# Patient Record
Sex: Female | Born: 1980 | Race: White | Hispanic: No | Marital: Married | Smoking: Current every day smoker
Health system: Southern US, Community
[De-identification: ages and names within clinical notes are randomized; demographics above are authoritative.]

## PROBLEM LIST (undated history)

## (undated) DIAGNOSIS — G43909 Migraine, unspecified, not intractable, without status migrainosus: Secondary | ICD-10-CM

## (undated) DIAGNOSIS — R32 Unspecified urinary incontinence: Secondary | ICD-10-CM

## (undated) DIAGNOSIS — F4310 Post-traumatic stress disorder, unspecified: Secondary | ICD-10-CM

## (undated) DIAGNOSIS — F419 Anxiety disorder, unspecified: Secondary | ICD-10-CM

## (undated) DIAGNOSIS — F1910 Other psychoactive substance abuse, uncomplicated: Secondary | ICD-10-CM

## (undated) DIAGNOSIS — F32A Depression, unspecified: Secondary | ICD-10-CM

## (undated) DIAGNOSIS — F329 Major depressive disorder, single episode, unspecified: Secondary | ICD-10-CM

## (undated) HISTORY — DX: Post-traumatic stress disorder, unspecified: F43.10

## (undated) HISTORY — DX: Anxiety disorder, unspecified: F41.9

## (undated) HISTORY — DX: Major depressive disorder, single episode, unspecified: F32.9

## (undated) HISTORY — DX: Unspecified urinary incontinence: R32

## (undated) HISTORY — DX: Depression, unspecified: F32.A

## (undated) HISTORY — DX: Migraine, unspecified, not intractable, without status migrainosus: G43.909

## (undated) HISTORY — PX: AUGMENTATION MAMMAPLASTY: SUR837

## (undated) HISTORY — DX: Other psychoactive substance abuse, uncomplicated: F19.10

---

## 2018-03-01 ENCOUNTER — Encounter (INDEPENDENT_AMBULATORY_CARE_PROVIDER_SITE_OTHER): Payer: Self-pay

## 2018-03-01 ENCOUNTER — Ambulatory Visit (INDEPENDENT_AMBULATORY_CARE_PROVIDER_SITE_OTHER): Payer: 59 | Admitting: Licensed Clinical Social Worker

## 2018-03-01 DIAGNOSIS — F4310 Post-traumatic stress disorder, unspecified: Secondary | ICD-10-CM | POA: Diagnosis not present
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
## 2018-03-07 ENCOUNTER — Ambulatory Visit: Payer: 59 | Admitting: Psychiatry

## 2018-03-10 ENCOUNTER — Ambulatory Visit: Payer: 59 | Admitting: Psychiatry

## 2018-03-10 ENCOUNTER — Ambulatory Visit: Payer: 59 | Admitting: Licensed Clinical Social Worker

## 2018-04-25 ENCOUNTER — Encounter: Payer: Self-pay | Admitting: Emergency Medicine

## 2018-04-25 ENCOUNTER — Emergency Department
Admission: EM | Admit: 2018-04-25 | Discharge: 2018-04-25 | Disposition: A | Discharge status: Home / Self Care | Payer: 59 | Attending: Emergency Medicine | Admitting: Emergency Medicine

## 2018-04-25 ENCOUNTER — Emergency Department: Payer: 59

## 2018-04-25 DIAGNOSIS — R110 Nausea: Secondary | ICD-10-CM | POA: Diagnosis not present

## 2018-04-25 DIAGNOSIS — R109 Unspecified abdominal pain: Secondary | ICD-10-CM | POA: Diagnosis present

## 2018-04-25 DIAGNOSIS — N200 Calculus of kidney: Secondary | ICD-10-CM | POA: Diagnosis not present

## 2018-04-25 LAB — BASIC METABOLIC PANEL
Anion gap: 10 (ref 5–15)
BUN: 18 mg/dL (ref 6–20)
CALCIUM: 9 mg/dL (ref 8.9–10.3)
CO2: 24 mmol/L (ref 22–32)
CREATININE: 0.9 mg/dL (ref 0.44–1.00)
Chloride: 103 mmol/L (ref 98–111)
GLUCOSE: 155 mg/dL — AB (ref 70–99)
Potassium: 3.9 mmol/L (ref 3.5–5.1)
Sodium: 137 mmol/L (ref 135–145)

## 2018-04-25 LAB — URINALYSIS, COMPLETE (UACMP) WITH MICROSCOPIC
Bacteria, UA: NONE SEEN
Bilirubin Urine: NEGATIVE
GLUCOSE, UA: NEGATIVE mg/dL
KETONES UR: NEGATIVE mg/dL
Leukocytes, UA: NEGATIVE
Nitrite: NEGATIVE
PH: 5 (ref 5.0–8.0)
PROTEIN: 100 mg/dL — AB
RBC / HPF: >50 RBC/hpf — ABNORMAL HIGH (ref 0–5)
Specific Gravity, Urine: 1.031 — ABNORMAL HIGH (ref 1.005–1.030)

## 2018-04-25 LAB — CBC
HCT: 40 % (ref 36.0–46.0)
Hemoglobin: 13.7 g/dL (ref 12.0–15.0)
MCH: 30.9 pg (ref 26.0–34.0)
MCHC: 34.3 g/dL (ref 30.0–36.0)
MCV: 90.1 fL (ref 80.0–100.0)
PLATELETS: 346 10*3/uL (ref 150–400)
RBC: 4.44 MIL/uL (ref 3.87–5.11)
RDW: 13.4 % (ref 11.5–15.5)
WBC: 8.3 10*3/uL (ref 4.0–10.5)
nRBC: 0 % (ref 0.0–0.2)

## 2018-04-25 LAB — PREGNANCY, URINE: PREG TEST UR: NEGATIVE

## 2018-04-25 MED ORDER — OXYCODONE-ACETAMINOPHEN 5-325 MG PO TABS
1.0000 | ORAL_TABLET | ORAL | Status: DC | PRN
  Administered 2018-04-25: 1 via ORAL
  Filled 2018-04-25: qty 1

## 2018-04-25 MED ORDER — HYDROMORPHONE HCL 1 MG/ML IJ SOLN
0.5000 mg | Freq: Once | INTRAMUSCULAR | Status: AC
  Administered 2018-04-25: 0.5 mg via INTRAVENOUS
  Filled 2018-04-25: qty 1

## 2018-04-25 MED ORDER — KETOROLAC TROMETHAMINE 30 MG/ML IJ SOLN
30.0000 mg | Freq: Once | INTRAMUSCULAR | Status: AC
  Administered 2018-04-25: 30 mg via INTRAVENOUS
  Filled 2018-04-25: qty 1

## 2018-04-25 MED ORDER — ONDANSETRON HCL 4 MG/2ML IJ SOLN
4.0000 mg | Freq: Once | INTRAMUSCULAR | Status: AC
  Administered 2018-04-25: 4 mg via INTRAVENOUS
  Filled 2018-04-25: qty 2

## 2018-04-25 MED ORDER — SODIUM CHLORIDE 0.9 % IV SOLN
Freq: Once | INTRAVENOUS | Status: AC
  Administered 2018-04-25: 999 mL via INTRAVENOUS

## 2018-04-25 MED ORDER — ONDANSETRON 4 MG PO TBDP: 4.0000 mg | ORAL_TABLET | Freq: Three times a day (TID) | ORAL | 0 refills | Status: DC | PRN

## 2018-04-25 MED ORDER — TAMSULOSIN HCL 0.4 MG PO CAPS: 0.4000 mg | ORAL_CAPSULE | Freq: Every day | ORAL | 0 refills | Status: DC

## 2018-04-25 MED ORDER — OXYCODONE-ACETAMINOPHEN 7.5-325 MG PO TABS: 1.0000 | ORAL_TABLET | ORAL | 0 refills | Status: DC | PRN

## 2018-04-25 NOTE — ED Notes (Signed)
Patient transported to CT 

## 2018-04-25 NOTE — ED Notes (Signed)
Urine preg added to labs, spoke with chelsea

## 2018-04-25 NOTE — ED Triage Notes (Signed)
Pt reports at 6am she started suddenly with pain to her left flank radiating around to her back and front. Pt reports pain is there always but increases intermittently.

## 2018-04-25 NOTE — ED Provider Notes (Signed)
[facility] Emergency Department Provider Note       Time seen: ----------------------------------------- 3:12 PM on 04/25/2018 -----------------------------------------   I have reviewed the triage vital signs and the nursing notes.  HISTORY   Chief Complaint Flank Pain and Abdominal Pain    HPI Joanna Lin is a 37 y.o. female with no significant past medical history who presents to the ED for sudden onset left flank pain that radiates from her back to her front.  Patient reports the pain is there always but increases intermittently.  Pain started around 6 AM, she has had some nausea but denies any other complaints.  Patient states she is currently on her menstrual cycle.  History reviewed. No pertinent past medical history.  There are no active problems to display for this patient.   History reviewed. No pertinent surgical history.  Allergies Penicillins  Social History Social History   Tobacco Use  . Smoking status: Not on file  Substance Use Topics  . Alcohol use: Not on file  . Drug use: Not on file   Review of Systems Constitutional: Negative for fever. Cardiovascular: Negative for chest pain. Respiratory: Negative for shortness of breath. Gastrointestinal: Positive for flank pain Musculoskeletal: Negative for back pain. Skin: Negative for rash. Neurological: Negative for headaches, focal weakness or numbness.  All systems negative/normal/unremarkable except as stated in the HPI  ____________________________________________   PHYSICAL EXAM:  VITAL SIGNS: ED Triage Vitals  Enc Vitals Group     BP 04/25/18 1031 (!) 184/103     Pulse Rate 04/25/18 1031 75     Resp 04/25/18 1439 (!) 22     Temp 04/25/18 1031 98.3 F (36.8 C)     Temp Source 04/25/18 1031 Oral     SpO2 04/25/18 1031 99 %     Weight 04/25/18 1031 175 lb (79.4 kg)     Height 04/25/18 1031 5\' 4"  (1.626 m)     Head Circumference --      Peak Flow --    Pain Score 04/25/18 1031 9     Pain Loc --      Pain Edu? --      Excl. in GC? --    Constitutional: Alert and oriented.  Mild distress Cardiovascular: Normal rate, regular rhythm. No murmurs, rubs, or gallops. Respiratory: Normal respiratory effort without tachypnea nor retractions. Breath sounds are clear and equal bilaterally. No wheezes/rales/rhonchi. Gastrointestinal: Left flank tenderness, no rebound or guarding.  Normal bowel sounds. Musculoskeletal: Nontender with normal range of motion in extremities. No lower extremity tenderness nor edema. Neurologic:  Normal speech and language. No gross focal neurologic deficits are appreciated.  Skin:  Skin is warm, dry and intact. No rash noted. Psychiatric: Mood and affect are normal. Speech and behavior are normal.  ____________________________________________  ED COURSE:  As part of my medical decision making, I reviewed the following data within the electronic MEDICAL RECORD NUMBER History obtained from family if available, nursing notes, old chart and ekg, as well as notes from prior ED visits. Patient presented for left flank pain, we will assess with labs and imaging as indicated at this time.   Procedures ____________________________________________   LABS (pertinent positives/negatives)  Labs Reviewed  URINALYSIS, COMPLETE (UACMP) WITH MICROSCOPIC - Abnormal; Notable for the following components:      Result Value   Color, Urine AMBER (*)    APPearance CLOUDY (*)    Specific Gravity, Urine 1.031 (*)    Hgb urine dipstick LARGE (*)  Protein, ur 100 (*)    RBC / HPF >50 (*)    All other components within normal limits  BASIC METABOLIC PANEL - Abnormal; Notable for the following components:   Glucose, Bld 155 (*)    All other components within normal limits  CBC  PREGNANCY, URINE    RADIOLOGY Images were viewed by me  CT renal protocol IMPRESSION: Moderate left hydroureteronephrosis with perinephric stranding is noted  secondary to 3 mm calculus at the left ureterovesical junction.  ____________________________________________  DIFFERENTIAL DIAGNOSIS   Renal colic, UTI, pyelonephritis, muscle strain, constipation  FINAL ASSESSMENT AND PLAN  Renal colic   Plan: The patient had presented for left flank pain. Patient's labs were reassuring with exception of hematuria. Patient's imaging did reveal a left-sided 3 mm kidney stone.  She will be referred to urology for outpatient follow-up.   Ulice Dash, MD   Note: This note was generated in part or whole with voice recognition software. Voice recognition is usually quite accurate but there are transcription errors that can and very often do occur. I apologize for any typographical errors that were not detected and corrected.     Emily Filbert, MD 04/25/18 [phone]

## 2018-05-10 ENCOUNTER — Ambulatory Visit: Payer: 59 | Admitting: Urology

## 2018-07-13 ENCOUNTER — Other Ambulatory Visit: Payer: Self-pay

## 2018-07-13 ENCOUNTER — Ambulatory Visit (INDEPENDENT_AMBULATORY_CARE_PROVIDER_SITE_OTHER): Payer: 59 | Admitting: Psychiatry

## 2018-07-13 ENCOUNTER — Encounter: Payer: Self-pay | Admitting: Psychiatry

## 2018-07-13 VITALS — BP 133/85 | HR 70 | Temp 97.6°F | Wt 181.8 lb

## 2018-07-13 DIAGNOSIS — F5105 Insomnia due to other mental disorder: Secondary | ICD-10-CM

## 2018-07-13 DIAGNOSIS — F17200 Nicotine dependence, unspecified, uncomplicated: Secondary | ICD-10-CM | POA: Diagnosis not present

## 2018-07-13 DIAGNOSIS — F1210 Cannabis abuse, uncomplicated: Secondary | ICD-10-CM

## 2018-07-13 DIAGNOSIS — F4310 Post-traumatic stress disorder, unspecified: Secondary | ICD-10-CM | POA: Diagnosis not present

## 2018-07-13 DIAGNOSIS — Z9189 Other specified personal risk factors, not elsewhere classified: Secondary | ICD-10-CM

## 2018-07-13 DIAGNOSIS — G4700 Insomnia, unspecified: Secondary | ICD-10-CM | POA: Insufficient documentation

## 2018-07-13 DIAGNOSIS — F411 Generalized anxiety disorder: Secondary | ICD-10-CM | POA: Insufficient documentation

## 2018-07-13 MED ORDER — QUETIAPINE FUMARATE 25 MG PO TABS: 25.0000 mg | ORAL_TABLET | Freq: Every day | ORAL | 1 refills | Status: DC

## 2018-07-13 MED ORDER — HYDROXYZINE HCL 25 MG PO TABS: 12.5000 mg | ORAL_TABLET | Freq: Three times a day (TID) | ORAL | 1 refills | Status: DC | PRN

## 2018-07-13 MED ORDER — TRAZODONE HCL 150 MG PO TABS: 150.0000 mg | ORAL_TABLET | Freq: Every evening | ORAL | 0 refills | Status: DC | PRN
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
## 2018-07-13 NOTE — Patient Instructions (Addendum)
Quetiapine tablets  What is this medicine?  QUETIAPINE (kwe TYE a peen) is an antipsychotic. It is used to treat schizophrenia and bipolar disorder, also known as manic-depression.  This medicine may be used for other purposes; ask your health care provider or pharmacist if you have questions.  COMMON BRAND NAME(S): Seroquel  What should I tell my health care provider before I take this medicine?  They need to know if you have any of these conditions:  -blockage in your bowel  -cataracts  -constipation  -dehydration  -diabetes  -difficulty swallowing  -glaucoma  -heart disease  -history of breast cancer  -kidney disease  -liver disease  -low blood counts, like low white cell, platelet, or red cell counts  -low blood pressure or dizziness when standing up  -Parkinson's disease  -previous heart attack  -prostate disease  -seizures  -stomach or intestine problems  -suicidal thoughts, plans or attempt; a previous suicide attempt by you or a family member  -thyroid disease  -trouble passing urine  -an unusual or allergic reaction to quetiapine, other medicines, foods, dyes, or preservatives  -pregnant or trying to get pregnant  -breast-feeding  How should I use this medicine?  Take this medicine by mouth. Swallow it with a drink of water. Follow the directions on the prescription label. If it upsets your stomach you can take it with food. Take your medicine at regular intervals. Do not take it more often than directed. Do not stop taking except on the advice of your doctor or health care professional.  A special MedGuide will be given to you by the pharmacist with each prescription and refill. Be sure to read this information carefully each time.  Talk to your pediatrician regarding the use of this medicine in children. While this drug may be prescribed for children as young as 10 years for selected conditions, precautions do apply.  Patients over age 39 years may have a stronger reaction to this medicine and need  smaller doses.  Overdosage: If you think you have taken too much of this medicine contact a poison control center or emergency room at once.  NOTE: This medicine is only for you. Do not share this medicine with others.  What if I miss a dose?  If you miss a dose, take it as soon as you can. If it is almost time for your next dose, take only that dose. Do not take double or extra doses.  What may interact with this medicine?  Do not take this medicine with any of the following medications:  -cisapride  -dofetilide  -dronedarone  -fluconazole  -metoclopramide  -pimozide  -posaconazole  -thioridazine  This medicine may also interact with the following medications:  -alcohol  -antihistamines for allergy cough and cold  -antiviral medicines for HIV or AIDS  -atropine  -certain medicines for bladder problems like oxybutynin, tolterodine  -certain medicines for blood pressure  -certain medicines for depression, anxiety, or psychotic disturbances  -certain medicines for diabetes  -certain medicines for stomach problems like dicyclomine, hyoscyamine  -certain medicines for travel sickness like scopolamine  -certain medicines for Parkinson's disease  -certain medicines for seizures like carbamazepine, phenobarbital, phenytoin  -cimetidine  -erythromycin  -ipratropium  -other medicines that prolong the QT interval (cause an abnormal heart rhythm)  -rifampin  -steroid medicines like prednisone or cortisone  This list may not describe all possible interactions. Give your health care provider a list of all the medicines, herbs, non-prescription drugs, or dietary supplements you use. Also   tell them if you smoke, drink alcohol, or use illegal drugs. Some items may interact with your medicine.  What should I watch for while using this medicine?  Visit your doctor or health care professional for regular checks on your progress. It may be several weeks before you see the full effects of this medicine.  Your health care provider may  suggest that you have your eyes examined prior to starting this medicine, and every 6 months thereafter.  If you have been taking this medicine regularly for some time, do not suddenly stop taking it. You must gradually reduce the dose or your symptoms may get worse. Ask your doctor or health care professional for advice.  Patients and their families should watch out for worsening depression or thoughts of suicide. Also watch out for sudden or severe changes in feelings such as feeling anxious, agitated, panicky, irritable, hostile, aggressive, impulsive, severely restless, overly excited and hyperactive, or not being able to sleep. If this happens, especially at the beginning of antidepressant treatment or after a change in dose, call your health care professional.  You may get dizzy or drowsy. Do not drive, use machinery, or do anything that needs mental alertness until you know how this medicine affects you. Do not stand or sit up quickly, especially if you are an older patient. This reduces the risk of dizzy or fainting spells. Alcohol can increase dizziness and drowsiness. Avoid alcoholic drinks.  Do not treat yourself for colds, diarrhea or allergies. Ask your doctor or health care professional for advice, some ingredients may increase possible side effects.  This medicine can reduce the response of your body to heat or cold. Dress warm in cold weather and stay hydrated in hot weather. If possible, avoid extreme temperatures like saunas, hot tubs, very hot or cold showers, or activities that can cause dehydration such as vigorous exercise.  What side effects may I notice from receiving this medicine?  Side effects that you should report to your doctor or health care professional as soon as possible:  -allergic reactions like skin rash, itching or hives, swelling of the face, lips, or tongue  -changes in vision  -difficulty swallowing  -elevated mood, decreased need for sleep, racing thoughts, impulsive  behavior  -eye pain  -redness, blistering, peeling, or loosening of the skin, including inside the mouth  -restlessness, pacing, inability to keep still  -seizures  -signs and symptoms of a dangerous change in heartbeat or heart rhythm like chest pain; dizziness; fast, irregular heartbeat; palpitations; feeling faint or lightheaded; falls; breathing problems  -signs and symptoms of high blood sugar such as dizziness; dry mouth; dry skin; fruity breath; nausea; stomach pain; increased hunger; increased thirst; increased urination  -signs and symptoms of hypothyroidism like fatigue; increased sensitivity to cold; weight gain; hoarseness; thinning hair  -signs and symptoms of infection like fever; chills; cough; sore throat; pain or trouble passing urine  -signs and symptoms of low blood pressure like dizziness; feeling faint or lightheaded; falls; unusually weak or tired  -signs and symptoms of neuroleptic malignant syndrome (NMS) like confusion; fast, irregular heartbeat; high fever; increased sweating; stiff muscles  -signs and symptoms of a stroke like changes in vision; confusion; trouble speaking or understanding; severe headaches; sudden numbness or weakness of the face, arm or leg; trouble walking; dizziness; loss of balance or coordination  -signs and symptoms of tardive dyskinesia, like uncontrollable head, mouth, neck, arm, or leg movements  -suicidal thoughts, mood changes  Side effects that usually do   NOTE: This sheet is a summary. It may not cover all possible information. If you have questions about this medicine, talk to your doctor, pharmacist, or health care provider.  2019 [employer] (2017-05-28 14:16:00) Hydroxyzine capsules or tablets What is this medicine? HYDROXYZINE (hye DROX i zeen) is an antihistamine. This medicine is used to treat allergy symptoms. It is also used to treat anxiety and tension. This medicine can be used with other medicines to induce sleep before surgery. This medicine may be used for other purposes; ask your health care provider or pharmacist if you have questions. COMMON BRAND NAME(S): ANX, Atarax, Rezine, Vistaril What should I tell my health care provider before I take this medicine? They need to know if you have any of these conditions: -glaucoma -heart disease -history of irregular heartbeat -kidney disease -liver disease -lung or breathing disease, like asthma -stomach or intestine problems -thyroid disease -trouble passing urine -an unusual or allergic reaction to hydroxyzine, cetirizine, other medicines, foods, dyes or preservatives -pregnant or trying to get pregnant -breast-feeding How should I use this medicine? Take this medicine by mouth with a full glass of water. Follow the directions on the prescription label. You may take this medicine with food or on an empty stomach. Take your medicine at regular intervals. Do not take your medicine more often than directed. Talk to your pediatrician regarding the use of this medicine in children. Special care may be needed. While this drug may be prescribed for children as young as 736 years of age for selected conditions, precautions do apply. Patients over 39 years old may have a stronger reaction and need a smaller dose. Overdosage: If you think you have taken too much of this medicine contact a poison control  center or emergency room at once. NOTE: This medicine is only for you. Do not share this medicine with others. What if I miss a dose? If you miss a dose, take it as soon as you can. If it is almost time for your next dose, take only that dose. Do not take double or extra doses. What may interact with this medicine? Do not take this medicine with any of the following medications: -cisapride -dofetilide -dronedarone -pimozide -thioridazine This medicine may also interact with the following medications: -alcohol -antihistamines for allergy, cough, and cold -atropine -barbiturate medicines for sleep or seizures, like phenobarbital -certain antibiotics like erythromycin or clarithromycin -certain medicines for anxiety or sleep -certain medicines for bladder problems like oxybutynin, tolterodine -certain medicines for depression or psychotic disturbances -certain medicines for irregular heart beat -certain medicines for Parkinson's disease like benztropine, trihexyphenidyl -certain medicines for seizures like phenobarbital, primidone -certain medicines for stomach problems like dicyclomine, hyoscyamine -certain medicines for travel sickness like scopolamine -ipratropium -narcotic medicines for pain -other medicines that prolong the QT interval (an abnormal heart rhythm) This list may not describe all possible interactions. Give your health care provider a list of all the medicines, herbs, non-prescription drugs, or dietary supplements you use. Also tell them if you smoke, drink alcohol, or use illegal drugs. Some items may interact with your medicine. What should I watch for while using this medicine? Tell your doctor or health care professional if your symptoms do not improve. You may get drowsy or dizzy. Do not drive, use machinery, or do anything that needs mental alertness until you know how this medicine affects you. Do not stand or sit up quickly, especially if you are an older  patient. This reduces the risk of dizzy or fainting  spells. Alcohol may interfere with the effect of this medicine. Avoid alcoholic drinks. Your mouth may get dry. Chewing sugarless gum or sucking hard candy, and drinking plenty of water may help. Contact your doctor if the problem does not go away or is severe. This medicine may cause dry eyes and blurred vision. If you wear contact lenses you may feel some discomfort. Lubricating drops may help. See your eye doctor if the problem does not go away or is severe. If you are receiving skin tests for allergies, tell your doctor you are using this medicine. What side effects may I notice from receiving this medicine? Side effects that you should report to your doctor or health care professional as soon as possible: -allergic reactions like skin rash, itching or hives, swelling of the face, lips, or tongue -changes in vision -confusion -fast, irregular heartbeat -seizures -tremor -trouble passing urine or change in the amount of urine Side effects that usually do not require medical attention (report to your doctor or health care professional if they continue or are bothersome): -constipation -drowsiness -dry mouth -headache -tiredness This list may not describe all possible side effects. Call your doctor for medical advice about side effects. You may report side effects to FDA at 1-800-FDA-1088. Where should I keep my medicine? Keep out of the reach of children. Store at room temperature between 15 and 30 degrees C (59 and 86 degrees F). Keep container tightly closed. Throw away any unused medicine after the expiration date. NOTE: This sheet is a summary. It may not cover all possible information. If you have questions about this medicine, talk to your doctor, pharmacist, or health care provider.  2019 [employer] Standard (2017-12-20 13:25:13)

## 2018-07-19 DIAGNOSIS — F4310 Post-traumatic stress disorder, unspecified: Secondary | ICD-10-CM | POA: Insufficient documentation

## 2018-07-19 DIAGNOSIS — F1210 Cannabis abuse, uncomplicated: Secondary | ICD-10-CM | POA: Insufficient documentation

## 2018-07-27 ENCOUNTER — Other Ambulatory Visit: Payer: Self-pay

## 2018-07-27 ENCOUNTER — Ambulatory Visit (INDEPENDENT_AMBULATORY_CARE_PROVIDER_SITE_OTHER): Payer: 59 | Admitting: Psychiatry

## 2018-07-27 ENCOUNTER — Encounter: Payer: Self-pay | Admitting: Psychiatry

## 2018-07-27 VITALS — BP 146/87 | HR 80 | Temp 98.0°F | Wt 181.6 lb

## 2018-07-27 DIAGNOSIS — F4310 Post-traumatic stress disorder, unspecified: Secondary | ICD-10-CM

## 2018-07-27 DIAGNOSIS — F5105 Insomnia due to other mental disorder: Secondary | ICD-10-CM | POA: Diagnosis not present

## 2018-07-27 DIAGNOSIS — Z9189 Other specified personal risk factors, not elsewhere classified: Secondary | ICD-10-CM

## 2018-07-27 DIAGNOSIS — F17200 Nicotine dependence, unspecified, uncomplicated: Secondary | ICD-10-CM | POA: Diagnosis not present

## 2018-07-27 MED ORDER — VILAZODONE HCL 20 MG PO TABS: 20.0000 mg | ORAL_TABLET | Freq: Every day | ORAL | 1 refills | Status: DC

## 2018-07-27 MED ORDER — QUETIAPINE FUMARATE 50 MG PO TABS: 50.0000 mg | ORAL_TABLET | Freq: Every evening | ORAL | 1 refills | Status: DC | PRN

## 2018-07-27 NOTE — Patient Instructions (Signed)
Vilazodone oral tablet What is this medicine? VILAZODONE (vil AZ oh done) is used to treat depression. This medicine may be used for other purposes; ask your health care provider or pharmacist if you have questions. COMMON BRAND NAME(S): [id number] What should I tell my health care provider before I take this medicine? They need to know if you have any of these conditions: -bipolar disorder or a family history of bipolar disorder -glaucoma -liver disease -low levels of sodium in the blood -receiving electroconvulsive therapy -seizures (convulsions) -suicidal thoughts, plans, or attempt by you or a family member -an unusual or allergic reaction to vilazodone, other medicines, foods, dyes or preservatives -pregnant or trying to get pregnant -breast-feeding How should I use this medicine? Take this medicine by mouth with a glass of water. Follow the directions on the prescription label. Take this medicine with food. Take your medicine at regular intervals. Do not take your medicine more often than directed. Do not stop taking this medicine suddenly except upon the advice of your doctor. Stopping this medicine too quickly may cause serious side effects or your condition may worsen. A special MedGuide will be given to you by the pharmacist with each prescription and refill. Be sure to read this information carefully each time. Overdosage: If you think you have taken too much of this medicine contact a poison control center or emergency room at once. NOTE: This medicine is only for you. Do not share this medicine with others. What if I miss a dose? If you miss a dose, take it as soon as you can. If it is almost time for your next dose, take only that dose. Do not take double or extra doses. What may interact with this medicine? Do not take this medicine with any of the following medications: -linezolid -MAOIs like Carbex, Eldepryl, Marplan, Nardil, and Parnate -methylene blue (injected into a  vein) This medicine may also interact with the following medications: -amphetamines -aspirin and aspirin-like medicines -buspirone -certain diet drugs like dexfenfluramine, fenfluramine, phentermine, sibutramine -certain migraine headache medicines like almotriptan, eletriptan, frovatriptan, naratriptan, rizatriptan, sumatriptan, zolmitriptan -certain medicines that treat or prevent blood clots like warfarin, enoxaparin, and dalteparin -certain medicines that treat infections like clarithromycin, itraconazole, voriconazole, ketoconazole, rifampin -certain medicines that treat seizures like carbamazepine and phenytoin -digoxin -fentanyl -lithium -NSAIDS, medicines for pain and inflammation, like ibuprofen or naproxen -other medicines for depression, anxiety, or psychotic disturbances -St. John's Wort -tramadol -tryptophan This list may not describe all possible interactions. Give your health care provider a list of all the medicines, herbs, non-prescription drugs, or dietary supplements you use. Also tell them if you smoke, drink alcohol, or use illegal drugs. Some items may interact with your medicine. What should I watch for while using this medicine? Tell your doctor if your symptoms do not get better or if they get worse. Visit your doctor or health care professional for regular checks on your progress. Because it may take several weeks to see the full effects of this medicine, it is important to continue your treatment as prescribed by your doctor. Patients and their families should watch out for new or worsening thoughts of suicide or depression. Also watch out for sudden changes in feelings such as feeling anxious, agitated, panicky, irritable, hostile, aggressive, impulsive, severely restless, overly excited and hyperactive, or not being able to sleep. If this happens, especially at the beginning of treatment or after a change in dose, call your health care professional. You may get  drowsy or dizzy. Do   not drive, use machinery, or do anything that needs mental alertness until you know how this medicine affects you. Do not stand or sit up quickly, especially if you are an older patient. This reduces the risk of dizzy or fainting spells. Alcohol may interfere with the effect of this medicine. Avoid alcoholic drinks. Your mouth may get dry. Chewing sugarless gum or sucking hard candy, and drinking plenty of water may help. Contact your doctor if the problem does not go away or is severe. What side effects may I notice from receiving this medicine? Side effects that you should report to your doctor or health care professional as soon as possible: -allergic reactions like skin rash, itching or hives, swelling of the face, lips, or tongue -anxious -black, tarry stools -changes in vision -confusion -elevated mood, decreased need for sleep, racing thoughts, impulsive behavior -eye pain -fast, irregular heartbeat -feeling faint or lightheaded, falls -feeling agitated, angry, or irritable -hallucination, loss of contact with reality -loss of balance or coordination -loss of memory -restlessness, pacing, inability to keep still -seizures -stiff muscles -suicidal thoughts or other mood changes -trouble sleeping -unusual bleeding or bruising -unusually weak or tired -vomiting Side effects that usually do not require medical attention (report to your doctor or health care professional if they continue or are bothersome): -change in appetite or weight -change in sex drive or performance -diarrhea -drowsiness -dry mouth -increased sweating -nausea -tremors This list may not describe all possible side effects. Call your doctor for medical advice about side effects. You may report side effects to FDA at 1-800-FDA-1088. Where should I keep my medicine? Keep out of the reach of children. Store at room temperature between 15 and 30 degrees C (59 to 86 degrees F). Throw away any  unused medicine after the expiration date. NOTE: This sheet is a summary. It may not cover all possible information. If you have questions about this medicine, talk to your doctor, pharmacist, or health care provider.  2019 [employer] Standard (2016-02-04 12:50:48)  
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
## 2018-08-04 ENCOUNTER — Telehealth: Payer: Self-pay | Admitting: Psychiatry

## 2018-08-04 ENCOUNTER — Telehealth: Payer: Self-pay

## 2018-08-04 DIAGNOSIS — F419 Anxiety disorder, unspecified: Secondary | ICD-10-CM

## 2018-08-04 MED ORDER — PROPRANOLOL HCL 10 MG PO TABS: 10.0000 mg | ORAL_TABLET | Freq: Three times a day (TID) | ORAL | 1 refills | Status: DC | PRN

## 2018-08-04 NOTE — Telephone Encounter (Signed)
pt stated that since she taking the seroquel she feels off on this medication, she been more irritability

## 2018-08-04 NOTE — Telephone Encounter (Signed)
Called patient - discussed to reduce Seroquel to 50 or 25 mg to help with side effects. Also will send Propranolol to pharmacy since vistaril is making her drowsy.

## 2018-08-10 ENCOUNTER — Ambulatory Visit: Payer: 59 | Admitting: Licensed Clinical Social Worker

## 2018-08-15 ENCOUNTER — Encounter: Payer: Self-pay | Admitting: Licensed Clinical Social Worker

## 2018-08-15 ENCOUNTER — Ambulatory Visit (INDEPENDENT_AMBULATORY_CARE_PROVIDER_SITE_OTHER): Payer: 59 | Admitting: Licensed Clinical Social Worker

## 2018-08-15 DIAGNOSIS — F419 Anxiety disorder, unspecified: Secondary | ICD-10-CM

## 2018-08-15 DIAGNOSIS — F4310 Post-traumatic stress disorder, unspecified: Secondary | ICD-10-CM | POA: Diagnosis not present
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
## 2018-08-18 ENCOUNTER — Ambulatory Visit (INDEPENDENT_AMBULATORY_CARE_PROVIDER_SITE_OTHER): Payer: 59 | Admitting: Psychiatry

## 2018-08-18 ENCOUNTER — Encounter: Payer: Self-pay | Admitting: Psychiatry

## 2018-08-18 DIAGNOSIS — F5105 Insomnia due to other mental disorder: Secondary | ICD-10-CM | POA: Diagnosis not present

## 2018-08-18 DIAGNOSIS — F17200 Nicotine dependence, unspecified, uncomplicated: Secondary | ICD-10-CM

## 2018-08-18 DIAGNOSIS — R451 Restlessness and agitation: Secondary | ICD-10-CM | POA: Diagnosis not present

## 2018-08-18 DIAGNOSIS — F4310 Post-traumatic stress disorder, unspecified: Secondary | ICD-10-CM | POA: Diagnosis not present

## 2018-08-18 MED ORDER — VILAZODONE HCL 40 MG PO TABS: 40.0000 mg | ORAL_TABLET | Freq: Every day | ORAL | 1 refills | Status: DC
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
## 2018-08-23 ENCOUNTER — Encounter: Payer: Self-pay | Admitting: Certified Nurse Midwife

## 2018-08-23 ENCOUNTER — Ambulatory Visit (INDEPENDENT_AMBULATORY_CARE_PROVIDER_SITE_OTHER): Payer: 59 | Admitting: Certified Nurse Midwife

## 2018-08-23 ENCOUNTER — Other Ambulatory Visit: Payer: Self-pay

## 2018-08-23 VITALS — BP 120/80 | HR 68 | Resp 16 | Ht 64.25 in | Wt 175.0 lb

## 2018-08-23 DIAGNOSIS — L731 Pseudofolliculitis barbae: Secondary | ICD-10-CM | POA: Diagnosis not present

## 2018-08-23 DIAGNOSIS — B373 Candidiasis of vulva and vagina: Secondary | ICD-10-CM

## 2018-08-23 DIAGNOSIS — B3731 Acute candidiasis of vulva and vagina: Secondary | ICD-10-CM

## 2018-08-23 DIAGNOSIS — N760 Acute vaginitis: Secondary | ICD-10-CM | POA: Diagnosis not present

## 2018-08-23 DIAGNOSIS — Z01419 Encounter for gynecological examination (general) (routine) without abnormal findings: Secondary | ICD-10-CM | POA: Diagnosis not present

## 2018-08-23 DIAGNOSIS — N898 Other specified noninflammatory disorders of vagina: Secondary | ICD-10-CM | POA: Diagnosis not present

## 2018-08-23 DIAGNOSIS — Z124 Encounter for screening for malignant neoplasm of cervix: Secondary | ICD-10-CM

## 2018-08-23 MED ORDER — FLUCONAZOLE 150 MG PO TABS: ORAL_TABLET | ORAL | 0 refills | Status: DC

## 2018-08-23 NOTE — Patient Instructions (Signed)
General topics  Next pap or exam is  due in 1 year Take a Women's multivitamin Take 1200 mg. of calcium daily - prefer dietary If any concerns in interim to call back  Breast Self-Awareness Practicing breast self-awareness may pick up problems early, prevent significant medical complications, and possibly save your life. By practicing breast self-awareness, you can become familiar with how your breasts look and feel and if your breasts are changing. This allows you to notice changes early. It can also offer you some reassurance that your breast health is good. One way to learn what is normal for your breasts and whether your breasts are changing is to do a breast self-exam. If you find a lump or something that was not present in the past, it is best to contact your caregiver right away. Other findings that should be evaluated by your caregiver include nipple discharge, especially if it is bloody; skin changes or reddening; areas where the skin seems to be pulled in (retracted); or new lumps and bumps. Breast pain is seldom associated with cancer (malignancy), but should also be evaluated by a caregiver. BREAST SELF-EXAM The best time to examine your breasts is 5 7 days after your menstrual period is over.  ExitCare Patient Information 2013 ExitCare, LLC.   Exercise to Stay Healthy Exercise helps you become and stay healthy. EXERCISE IDEAS AND TIPS Choose exercises that:  You enjoy.  Fit into your day. You do not need to exercise really hard to be healthy. You can do exercises at a slow or medium level and stay healthy. You can:  Stretch before and after working out.  Try yoga, Pilates, or tai chi.  Lift weights.  Walk fast, swim, jog, run, climb stairs, bicycle, dance, or rollerskate.  Take aerobic classes. Exercises that burn about 150 calories:  Running 1  miles in 15 minutes.  Playing volleyball for 45 to 60 minutes.  Washing and waxing a car for 45 to 60  minutes.  Playing touch football for 45 minutes.  Walking 1  miles in 35 minutes.  Pushing a stroller 1  miles in 30 minutes.  Playing basketball for 30 minutes.  Raking leaves for 30 minutes.  Bicycling 5 miles in 30 minutes.  Walking 2 miles in 30 minutes.  Dancing for 30 minutes.  Shoveling snow for 15 minutes.  Swimming laps for 20 minutes.  Walking up stairs for 15 minutes.  Bicycling 4 miles in 15 minutes.  Gardening for 30 to 45 minutes.  Jumping rope for 15 minutes.  Washing windows or floors for 45 to 60 minutes. Document Released: 07/11/2010 Document Revised: 08/31/2011 Document Reviewed: 07/11/2010 ExitCare Patient Information 2013 ExitCare[employer] LLC.   Other topics ( that may be useful information):    Sexually Transmitted Disease Sexually transmitted disease (STD) refers to any infection that is passed from person to person during sexual activity. This may happen by way of saliva, semen, blood, vaginal mucus, or urine. Common STDs include:  Gonorrhea.  Chlamydia.  Syphilis.  HIV/AIDS.  Genital herpes.  Hepatitis B and C.  Trichomonas.  Human papillomavirus (HPV).  Pubic lice. CAUSES  An STD may be spread by bacteria, virus, or parasite. A person can get an STD by:  Sexual intercourse with an infected person.  Sharing sex toys with an infected person.  Sharing needles with an infected person.  Having intimate contact with the genitals, mouth, or rectal areas of an infected person. SYMPTOMS  Some people may not have any symptoms, but   they can still pass the infection to others. Different STDs have different symptoms. Symptoms include:  Painful or bloody urination.  Pain in the pelvis, abdomen, vagina, anus, throat, or eyes.  Skin rash, itching, irritation, growths, or sores (lesions). These usually occur in the genital or anal area.  Abnormal vaginal discharge.  Penile discharge in men.  Soft, flesh-colored skin growths in the  genital or anal area.  Fever.  Pain or bleeding during sexual intercourse.  Swollen glands in the groin area.  Yellow skin and eyes (jaundice). This is seen with hepatitis. DIAGNOSIS  To make a diagnosis, your caregiver may:  Take a medical history.  Perform a physical exam.  Take a specimen (culture) to be examined.  Examine a sample of discharge under a microscope.  Perform blood test TREATMENT   Chlamydia, gonorrhea, trichomonas, and syphilis can be cured with antibiotic medicine.  Genital herpes, hepatitis, and HIV can be treated, but not cured, with prescribed medicines. The medicines will lessen the symptoms.  Genital warts from HPV can be treated with medicine or by freezing, burning (electrocautery), or surgery. Warts may come back.  HPV is a virus and cannot be cured with medicine or surgery.However, abnormal areas may be followed very closely by your caregiver and may be removed from the cervix, vagina, or vulva through office procedures or surgery. If your diagnosis is confirmed, your recent sexual partners need treatment. This is true even if they are symptom-free or have a negative culture or evaluation. They should not have sex until their caregiver says it is okay. HOME CARE INSTRUCTIONS  All sexual partners should be informed, tested, and treated for all STDs.  Take your antibiotics as directed. Finish them even if you start to feel better.  Only take over-the-counter or prescription medicines for pain, discomfort, or fever as directed by your caregiver.  Rest.  Eat a balanced diet and drink enough fluids to keep your urine clear or pale yellow.  Do not have sex until treatment is completed and you have followed up with your caregiver. STDs should be checked after treatment.  Keep all follow-up appointments, Pap tests, and blood tests as directed by your caregiver.  Only use latex condoms and water-soluble lubricants during sexual activity. Do not use  petroleum jelly or oils.  Avoid alcohol and illegal drugs.  Get vaccinated for HPV and hepatitis. If you have not received these vaccines in the past, talk to your caregiver about whether one or both might be right for you.  Avoid risky sex practices that can break the skin. The only way to avoid getting an STD is to avoid all sexual activity.Latex condoms and dental dams (for oral sex) will help lessen the risk of getting an STD, but will not completely eliminate the risk. SEEK MEDICAL CARE IF:   You have a fever.  You have any new or worsening symptoms. Document Released: 08/29/2002 Document Revised: 08/31/2011 Document Reviewed: 09/05/2010 [facility] Patient Information 2013 [place].    Domestic Abuse You are being battered or abused if someone close to you hits, pushes, or physically hurts you in any way. You also are being abused if you are forced into activities. You are being sexually abused if you are forced to have sexual contact of any kind. You are being emotionally abused if you are made to feel worthless or if you are constantly threatened. It is important to remember that help is available. No one has the right to abuse you. PREVENTION OF FURTHER  ABUSE  Learn the warning signs of danger. This varies with situations but may include: the use of alcohol, threats, isolation from friends and family, or forced sexual contact. Leave if you feel that violence is going to occur.  If you are attacked or beaten, report it to the police so the abuse is documented. You do not have to press charges. The police can protect you while you or the attackers are leaving. Get the officer's name and badge number and a copy of the report.  Find someone you can trust and tell them what is happening to you: your caregiver, a nurse, clergy member, close friend or family member. Feeling ashamed is natural, but remember that you have done nothing wrong. No one deserves abuse. Document Released:  06/05/2000 Document Revised: 08/31/2011 Document Reviewed: 08/14/2010 ExitCare Patient Information 2013 ExitCare, LLC.    How Much is Too Much Alcohol? Drinking too much alcohol can cause injury, accidents, and health problems. These types of problems can include:   Car crashes.  Falls.  Family fighting (domestic violence).  Drowning.  Fights.  Injuries.  Burns.  Damage to certain organs.  Having a baby with birth defects. ONE DRINK CAN BE TOO MUCH WHEN YOU ARE:  Working.  Pregnant or breastfeeding.  Taking medicines. Ask your doctor.  Driving or planning to drive. If you or someone you know has a drinking problem, get help from a doctor.  Document Released: 04/04/2009 Document Revised: 08/31/2011 Document Reviewed: 04/04/2009 ExitCare Patient Information 2013 ExitCare, LLC.   Smoking Hazards Smoking cigarettes is extremely bad for your health. Tobacco smoke has over 200 known poisons in it. There are over 60 chemicals in tobacco smoke that cause cancer. Some of the chemicals found in cigarette smoke include:   Cyanide.  Benzene.  Formaldehyde.  Methanol (wood alcohol).  Acetylene (fuel used in welding torches).  Ammonia. Cigarette smoke also contains the poisonous gases nitrogen oxide and carbon monoxide.  Cigarette smokers have an increased risk of many serious medical problems and Smoking causes approximately:  90% of all lung cancer deaths in men.  80% of all lung cancer deaths in women.  90% of deaths from chronic obstructive lung disease. Compared with nonsmokers, smoking increases the risk of:  Coronary heart disease by 2 to 4 times.  Stroke by 2 to 4 times.  Men developing lung cancer by 23 times.  Women developing lung cancer by 13 times.  Dying from chronic obstructive lung diseases by 12 times.  . Smoking is the most preventable cause of death and disease in our society.  WHY IS SMOKING ADDICTIVE?  Nicotine is the chemical  agent in tobacco that is capable of causing addiction or dependence.  When you smoke and inhale, nicotine is absorbed rapidly into the bloodstream through your lungs. Nicotine absorbed through the lungs is capable of creating a powerful addiction. Both inhaled and non-inhaled nicotine may be addictive.  Addiction studies of cigarettes and spit tobacco show that addiction to nicotine occurs mainly during the teen years, when young people begin using tobacco products. WHAT ARE THE BENEFITS OF QUITTING?  There are many health benefits to quitting smoking.   Likelihood of developing cancer and heart disease decreases. Health improvements are seen almost immediately.  Blood pressure, pulse rate, and breathing patterns start returning to normal soon after quitting. QUITTING SMOKING   American Lung Association - 1-800-LUNGUSA  American Cancer Society - 1-800-ACS-2345 Document Released: 07/16/2004 Document Revised: 08/31/2011 Document Reviewed: 03/20/2009 ExitCare Patient Information 2013 ExitCare,   LLC.   Stress Management Stress is a state of physical or mental tension that often results from changes in your life or normal routine. Some common causes of stress are:  Death of a loved one.  Injuries or severe illnesses.  Getting fired or changing jobs.  Moving into a new home. Other causes may be:  Sexual problems.  Business or financial losses.  Taking on a large debt.  Regular conflict with someone at home or at work.  Constant tiredness from lack of sleep. It is not just bad things that are stressful. It may be stressful to:  Win the lottery.  Get married.  Buy a new car. The amount of stress that can be easily tolerated varies from person to person. Changes generally cause stress, regardless of the types of change. Too much stress can affect your health. It may lead to physical or emotional problems. Too little stress (boredom) may also become stressful. SUGGESTIONS TO  REDUCE STRESS:  Talk things over with your family and friends. It often is helpful to share your concerns and worries. If you feel your problem is serious, you may want to get help from a professional counselor.  Consider your problems one at a time instead of lumping them all together. Trying to take care of everything at once may seem impossible. List all the things you need to do and then start with the most important one. Set a goal to accomplish 2 or 3 things each day. If you expect to do too many in a single day you will naturally fail, causing you to feel even more stressed.  Do not use alcohol or drugs to relieve stress. Although you may feel better for a short time, they do not remove the problems that caused the stress. They can also be habit forming.  Exercise regularly - at least 3 times per week. Physical exercise can help to relieve that "uptight" feeling and will relax you.  The shortest distance between despair and hope is often a good night's sleep.  Go to bed and get up on time allowing yourself time for appointments without being rushed.  Take a short "time-out" period from any stressful situation that occurs during the day. Close your eyes and take some deep breaths. Starting with the muscles in your face, tense them, hold it for a few seconds, then relax. Repeat this with the muscles in your neck, shoulders, hand, stomach, back and legs.  Take good care of yourself. Eat a balanced diet and get plenty of rest.  Schedule time for having fun. Take a break from your daily routine to relax. HOME CARE INSTRUCTIONS   Call if you feel overwhelmed by your problems and feel you can no longer manage them on your own.  Return immediately if you feel like hurting yourself or someone else. Document Released: 12/02/2000 Document Revised: 08/31/2011 Document Reviewed: 07/25/2007 [facility] Patient Information 2013 [place].   Vaginal Yeast infection, Adult  Vaginal yeast  infection is a condition that causes vaginal discharge as well as soreness, swelling, and redness (inflammation) of the vagina. This is a common condition. Some women get this infection frequently. What are the causes? This condition is caused by a change in the normal balance of the yeast (candida) and bacteria that live in the vagina. This change causes an overgrowth of yeast, which causes the inflammation. What increases the risk? The condition is more likely to develop in women who:  Take antibiotic medicines.  Have diabetes.  Take birth control pills.  Are pregnant.  Douche often.  Have a weak body defense system (immune system).  Have been taking steroid medicines for a long time.  Frequently wear tight clothing. What are the signs or symptoms? Symptoms of this condition include:  White, thick, creamy vaginal discharge.  Swelling, itching, redness, and irritation of the vagina. The lips of the vagina (vulva) may be affected as well.  Pain or a burning feeling while urinating.  Pain during sex. How is this diagnosed? This condition is diagnosed based on:  Your medical history.  A physical exam.  A pelvic exam. Your health care provider will examine a sample of your vaginal discharge under a microscope. Your health care provider may send this sample for testing to confirm the diagnosis. How is this treated? This condition is treated with medicine. Medicines may be over-the-counter or prescription. You may be told to use one or more of the following:  Medicine that is taken by mouth (orally).  Medicine that is applied as a cream (topically).  Medicine that is inserted directly into the vagina (suppository). Follow these instructions at home:  Lifestyle  Do not have sex until your health care provider approves. Tell your sex partner that you have a yeast infection. That person should go to his or her health care provider and ask if they should also be  treated.  Do not wear tight clothes, such as pantyhose or tight pants.  Wear breathable cotton underwear. General instructions  Take or apply over-the-counter and prescription medicines only as told by your health care provider.  Eat more yogurt. This may help to keep your yeast infection from returning.  Do not use tampons until your health care provider approves.  Try taking a sitz bath to help with discomfort. This is a warm water bath that is taken while you are sitting down. The water should only come up to your hips and should cover your buttocks. Do this 3-4 times per day or as told by your health care provider.  Do not douche.  If you have diabetes, keep your blood sugar levels under control.  Keep all follow-up visits as told by your health care provider. This is important. Contact a health care provider if:  You have a fever.  Your symptoms go away and then return.  Your symptoms do not get better with treatment.  Your symptoms get worse.  You have new symptoms.  You develop blisters in or around your vagina.  You have blood coming from your vagina and it is not your menstrual period.  You develop pain in your abdomen. Summary  Vaginal yeast infection is a condition that causes discharge as well as soreness, swelling, and redness (inflammation) of the vagina.  This condition is treated with medicine. Medicines may be over-the-counter or prescription.  Take or apply over-the-counter and prescription medicines only as told by your health care provider.  Do not douche. Do not have sex or use tampons until your health care provider approves.  Contact a health care provider if your symptoms do not get better with treatment or your symptoms go away and then return. This information is not intended to replace advice given to you by your health care provider. Make sure you discuss any questions you have with your health care provider. Document Released: 03/18/2005  Document Revised: 10/25/2017 Document Reviewed: 10/25/2017 Elsevier Interactive Patient Education  2019 [employer].

## 2018-08-23 NOTE — Progress Notes (Addendum)
38 y.o. [id number] Married  Caucasian Fe here to establish gyn care, for annual exam and  has problem with itching in vaginal area. Last exam 08/30/17 at [facility]. Some stress incontinence after last pregnancy only with full bladder.. Complaining of vaginal irritation  which has continued for 2-3 months and OTC Monistat/Clotrimazole use with no change. Feels like she has a boil on lip area, that opened in the past 24 hours, please check. Noted after shaving last time. Has noted some color change in the labia area also. No STD concerns or new products. "Miserable with itching." Periods normal, monthly, no change in amount. Minimal cramping. Seeing Counselor for anxiety/depression, working well with medication use. No other health concerns today.  Patient's last menstrual period was 08/14/2018 (exact date).          Sexually active: Yes.    The current method of family planning is vasectomy   Exercising: Yes.    walking Smoker:  yes  Review of Systems  Constitutional: Negative.   HENT: Negative.   Eyes: Negative.   Respiratory: Negative.   Cardiovascular: Negative.   Skin: Positive for itching.       In vaginal area  Neurological: Negative.     Health Maintenance: Pap: ? History of Abnormal Pap: no MMG:  none Self Breast exams: yes Colonoscopy:  none BMD:   none TDaP:  2018 Shingles: no Pneumonia: no Hep C and HIV: done per patient Labs: yes if needed   reports that she has been smoking cigarettes. She has never used smokeless tobacco. She reports current alcohol use of about 1.0 - 2.0 standard drinks of alcohol per week. She reports previous drug use.  Past Medical History:  Diagnosis Date  . Anxiety   . Depression   . Migraines   . PTSD (post-traumatic stress disorder)   . Substance abuse (HCC)   . Urinary incontinence     Past Surgical History:  Procedure Laterality Date  . AUGMENTATION MAMMAPLASTY      Current Outpatient Medications  Medication Sig Dispense Refill  .  propranolol (INDERAL) 10 MG tablet Take 10 mg by mouth 3 (three) times daily.    . QUEtiapine (SEROQUEL) 50 MG tablet Take 1-1.5 tablets (50-75 mg total) by mouth at bedtime as needed. For mood and sleep (Patient taking differently: Take 50 mg by mouth at bedtime as needed. For mood and sleep) 45 tablet 1  . Vilazodone HCl (VIIBRYD) 20 MG TABS Take by mouth.    . Vilazodone HCl (VIIBRYD) 40 MG TABS Take 1 tablet (40 mg total) by mouth daily. (Patient not taking: Reported on 08/23/2018) 30 tablet 1   No current facility-administered medications for this visit.     Family History  Problem Relation Age of Onset  . Bipolar disorder Father   . Drug abuse Father   . Alcohol abuse Father   . Drug abuse Paternal Uncle   . Alcohol abuse Paternal Grandfather   . Anxiety disorder Paternal Grandmother   . Depression Paternal Grandmother     ROS:  Pertinent items are noted in HPI.  Otherwise, a comprehensive ROS was negative.  Exam:   BP 120/80   Pulse 68   Resp 16   Ht 5' 4.25" (1.632 m)   Wt 175 lb (79.4 kg)   LMP 08/14/2018 (Exact Date)   BMI 29.81 kg/m  Height: 5' 4.25" (163.2 cm) Ht Readings from Last 3 Encounters:  08/23/18 5' 4.25" (1.632 m)  04/25/18 5\' 4"  (1.626 m)  General appearance: alert, cooperative and appears stated age Head: Normocephalic, without obvious abnormality, atraumatic Neck: no adenopathy, supple, symmetrical, trachea midline and thyroid normal to inspection and palpation Lungs: clear to auscultation bilaterally Breasts: normal appearance, no masses or tenderness, No nipple retraction or dimpling, No nipple discharge or bleeding, No axillary or supraclavicular adenopathy Heart: regular rate and rhythm Abdomen: soft, non-tender; no masses,  no organomegaly Extremities: extremities normal, atraumatic, no cyanosis or edema Skin: Skin color, texture, turgor normal. No rashes or lesions Lymph nodes: Cervical, supraclavicular, and axillary nodes normal. No  abnormal inguinal nodes palpated Neurologic: Grossly normal   Pelvic: External genitalia: Normal female, with shaved area noted with ingrown hair pimple noted on left, soft, non tender, appears to be resolving. Small cracked area on inside of left labia only. White exudate noted and wet prep taken              Urethra:  normal appearing urethra with no masses, tenderness or lesions              Bartholin's and Skene's: normal                 Vagina: normal appearing vagina with normal color and white watery slight odorous discharge, no lesions Lab: Affirm taken, ph 4.0              Cervix: multiparous appearance, no cervical motion tenderness and no lesions              Pap taken: Yes.   Bimanual Exam:  Uterus:  normal size, contour, position, consistency, mobility, non-tender and anteverted              Adnexa: normal adnexa and no mass, fullness, tenderness               Rectovaginal: Confirms               Anus:  normal sphincter tone, no lesions  Wet Prep: KOH, Saline + yeast on skin only  Chaperone present: yes  A:  Well Woman with normal exam  Contraception spouse vasectomy  Yeast vulvitis  R/O vaginitis  Ingrown hair on left vulva resolving  Anxiety/depression with MD management    P:   Reviewed health and wellness pertinent to exam  Discussed finding of yeast vulvitis and etiology. Discussed Aveeno sitz bath for comfort. Use 1 % hydrocortisone to skin twice daily for 5 days.  Rx Diflucan see order with instructions, which will help with itching.  Lab: Affirm will treat as indicated  Discussed ingrown hair finding and use of Epsom salt soak to encourage area to heal and prevent other areas from occurring. Questions addressed.  Continue follow up with MD as indicated.  Pap smear:yes   counseled on breast self exam, feminine hygiene, adequate intake of calcium and vitamin D, diet and exercise  return annually or prn  An After Visit Summary was printed and given to the  patient.

## 2018-08-24 ENCOUNTER — Other Ambulatory Visit (HOSPITAL_COMMUNITY)
Admission: RE | Admit: 2018-08-24 | Discharge: 2018-08-24 | Disposition: A | Discharge status: Home / Self Care | Payer: 59 | Source: Ambulatory Visit | Attending: Certified Nurse Midwife | Admitting: Certified Nurse Midwife

## 2018-08-24 DIAGNOSIS — Z01419 Encounter for gynecological examination (general) (routine) without abnormal findings: Secondary | ICD-10-CM | POA: Insufficient documentation

## 2018-08-24 DIAGNOSIS — Z124 Encounter for screening for malignant neoplasm of cervix: Secondary | ICD-10-CM | POA: Diagnosis not present

## 2018-08-24 LAB — VAGINITIS/VAGINOSIS, DNA PROBE
CANDIDA SPECIES: NEGATIVE
GARDNERELLA VAGINALIS: NEGATIVE
TRICHOMONAS VAG: NEGATIVE

## 2018-08-24 NOTE — Addendum Note (Signed)
Addended by: Verner Chol on: 08/24/2018 09:13 AM   Modules accepted: Orders

## 2018-08-26 LAB — CYTOLOGY - PAP: HPV: NOT DETECTED

## 2018-08-27 ENCOUNTER — Other Ambulatory Visit: Payer: Self-pay | Admitting: Certified Nurse Midwife

## 2018-08-27 DIAGNOSIS — R87612 Low grade squamous intraepithelial lesion on cytologic smear of cervix (LGSIL): Secondary | ICD-10-CM

## 2018-08-30 ENCOUNTER — Telehealth: Payer: Self-pay | Admitting: *Deleted

## 2018-08-30 DIAGNOSIS — R87612 Low grade squamous intraepithelial lesion on cytologic smear of cervix (LGSIL): Secondary | ICD-10-CM

## 2018-08-30 NOTE — Telephone Encounter (Signed)
Spoke with patient. Advised as seen below per Leota Sauers, CNM. Brief explanation of colpo provided, questions answered. LMP 08/12/18, reports menses have been irregular. SA, spouse vasectomy. Patient is leaving to go out of town next week, will return call with first day of menses to schedule colpo. Order previously placed for colpo.    1 month recall placed.   Routing to provider for final review. Patient is agreeable to disposition. Will close encounter.   Cc: Soundra Pilon, [address] [employer]

## 2018-08-30 NOTE — Telephone Encounter (Signed)
Notes recorded by Leda Min, RN on 08/30/2018 at 9:34 AM EDT Left message to call Noreene Larsson, RN at [facility] [phone].

## 2018-08-30 NOTE — Telephone Encounter (Signed)
-----   Message from Verner Chol, CNM sent at 08/27/2018  2:24 PM EST ----- Notify patient her pap smear showed LSIL with HPV not detected. Will need colposcopy scheduled to evaluate. Order placed

## 2018-08-30 NOTE — Telephone Encounter (Signed)
Patient is returning a call to Jill. °

## 2018-09-08 ENCOUNTER — Ambulatory Visit: Payer: 59 | Admitting: Licensed Clinical Social Worker

## 2018-09-12 ENCOUNTER — Telehealth: Payer: Self-pay | Admitting: *Deleted

## 2018-09-12 NOTE — Telephone Encounter (Signed)
Patient notified of recommendations from Dr Oscar La. Annual exam scheduled for 09-07-19 and 08 recall placed. Stressed importance of pap and HPV testing in one year.   Encounter closed.

## 2018-09-12 NOTE — Telephone Encounter (Signed)
Call to patient. Left message to call back. Calling to review update to pap result.

## 2018-09-12 NOTE — Telephone Encounter (Signed)
-----   Message from Romualdo Bolk, MD sent at 09/12/2018  9:57 AM EDT ----- Please let the patient know that I have reviewed her chart and pap result. Her pap returned with LSIL with negative HPV. While doing a colposcopy is acceptable, the preferred course is a repeat pap and hpv in one year. If her pap was abnormal next year then she would need a colposcopy.

## 2018-09-15 ENCOUNTER — Other Ambulatory Visit: Payer: Self-pay | Admitting: Psychiatry

## 2018-09-15 DIAGNOSIS — F4310 Post-traumatic stress disorder, unspecified: Secondary | ICD-10-CM

## 2018-09-19 ENCOUNTER — Other Ambulatory Visit: Payer: Self-pay | Admitting: Psychiatry

## 2018-09-19 DIAGNOSIS — F5105 Insomnia due to other mental disorder: Secondary | ICD-10-CM

## 2018-09-19 DIAGNOSIS — F4310 Post-traumatic stress disorder, unspecified: Secondary | ICD-10-CM

## 2018-09-24 ENCOUNTER — Other Ambulatory Visit: Payer: Self-pay | Admitting: Psychiatry

## 2018-09-24 DIAGNOSIS — F5105 Insomnia due to other mental disorder: Secondary | ICD-10-CM

## 2018-09-24 DIAGNOSIS — F4310 Post-traumatic stress disorder, unspecified: Secondary | ICD-10-CM

## 2018-09-26 ENCOUNTER — Other Ambulatory Visit: Payer: Self-pay

## 2018-09-26 ENCOUNTER — Encounter: Payer: Self-pay | Admitting: Psychiatry

## 2018-09-26 ENCOUNTER — Ambulatory Visit (INDEPENDENT_AMBULATORY_CARE_PROVIDER_SITE_OTHER): Payer: 59 | Admitting: Psychiatry

## 2018-09-26 DIAGNOSIS — F17200 Nicotine dependence, unspecified, uncomplicated: Secondary | ICD-10-CM

## 2018-09-26 DIAGNOSIS — F4310 Post-traumatic stress disorder, unspecified: Secondary | ICD-10-CM | POA: Diagnosis not present

## 2018-09-26 DIAGNOSIS — F17210 Nicotine dependence, cigarettes, uncomplicated: Secondary | ICD-10-CM

## 2018-09-26 DIAGNOSIS — F5105 Insomnia due to other mental disorder: Secondary | ICD-10-CM

## 2018-09-26 DIAGNOSIS — F39 Unspecified mood [affective] disorder: Secondary | ICD-10-CM

## 2018-09-26 MED ORDER — VILAZODONE HCL 40 MG PO TABS: 40.0000 mg | ORAL_TABLET | Freq: Every day | ORAL | 0 refills | Status: AC

## 2018-09-26 MED ORDER — QUETIAPINE FUMARATE 50 MG PO TABS: 50.0000 mg | ORAL_TABLET | Freq: Every day | ORAL | 0 refills | Status: DC

## 2018-09-26 MED ORDER — DIVALPROEX SODIUM ER 250 MG PO TB24: 750.0000 mg | ORAL_TABLET | Freq: Every evening | ORAL | 1 refills | Status: DC
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
## 2018-09-26 NOTE — Progress Notes (Signed)
TC on  09-26-18 @ 10:54 spoke with patients . Medications and pharmacy was reviewed and no changes.  Allergies , medical hx and surgery hx was reviewed with no changes.  Vital unable to obtain due to this is a phone or web consult.

## 2018-09-26 NOTE — Patient Instructions (Signed)
Valproic Acid, Divalproex Sodium delayed or extended-release tablets What is this medicine? DIVALPROEX SODIUM (dye VAL pro ex SO dee um) is used to prevent seizures caused by some forms of epilepsy. It is also used to treat bipolar mania and to prevent migraine headaches. This medicine may be used for other purposes; ask your health care provider or pharmacist if you have questions. COMMON BRAND NAME(S): Depakote, Depakote ER What should I tell my health care provider before I take this medicine? They need to know if you have any of these conditions: -if you often drink alcohol -kidney disease -liver disease -low platelet counts -mitochondrial disease -suicidal thoughts, plans, or attempt; a previous suicide attempt by you or a family member -urea cycle disorder (UCD) -an unusual or allergic reaction to divalproex sodium, sodium valproate, valproic acid, other medicines, foods, dyes, or preservatives -pregnant or trying to get pregnant -breast-feeding How should I use this medicine? Take this medicine by mouth with a drink of water. Follow the directions on the prescription label. Do not cut, crush or chew this medicine. You can take it with or without food. If it upsets your stomach, take it with food. Take your medicine at regular intervals. Do not take it more often than directed. Do not stop taking except on your doctor's advice. A special MedGuide will be given to you by the pharmacist with each prescription and refill. Be sure to read this information carefully each time. Talk to your pediatrician regarding the use of this medicine in children. While this drug may be prescribed for children as young as 10 years for selected conditions, precautions do apply. Overdosage: If you think you have taken too much of this medicine contact a poison control center or emergency room at once. NOTE: This medicine is only for you. Do not share this medicine with others. What if I miss a dose? If you  miss a dose, take it as soon as you can. If it is almost time for your next dose, take only that dose. Do not take double or extra doses. What may interact with this medicine? Do not take this medicine with any of the following medications: -sodium phenylbutyrate This medicine may also interact with the following medications: -aspirin -certain antibiotics like ertapenem, imipenem, meropenem -certain medicines for depression, anxiety, or psychotic disturbances -certain medicines for seizures like carbamazepine, clonazepam, diazepam, ethosuximide, felbamate, lamotrigine, phenobarbital, phenytoin, primidone, rufinamide, topiramate -certain medicines that treat or prevent blood clots like warfarin -cholestyramine -female hormones, like estrogens and birth control pills, patches, or rings -propofol -rifampin -ritonavir -tolbutamide -zidovudine This list may not describe all possible interactions. Give your health care provider a list of all the medicines, herbs, non-prescription drugs, or dietary supplements you use. Also tell them if you smoke, drink alcohol, or use illegal drugs. Some items may interact with your medicine. What should I watch for while using this medicine? Tell your doctor or health care professional if your symptoms do not get better or they start to get worse. Wear a medical ID bracelet or chain, and carry a card that describes your disease and details of your medicine and dosage times. You may get drowsy, dizzy, or have blurred vision. Do not drive, use machinery, or do anything that needs mental alertness until you know how this medicine affects you. To reduce dizzy or fainting spells, do not sit or stand up quickly, especially if you are an older patient. Alcohol can increase drowsiness and dizziness. Avoid alcoholic drinks. This medicine can make   you more sensitive to the sun. Keep out of the sun. If you cannot avoid being in the sun, wear protective clothing and use  sunscreen. Do not use sun lamps or tanning beds/booths. Patients and their families should watch out for new or worsening depression or thoughts of suicide. Also watch out for sudden changes in feelings such as feeling anxious, agitated, panicky, irritable, hostile, aggressive, impulsive, severely restless, overly excited and hyperactive, or not being able to sleep. If this happens, especially at the beginning of treatment or after a change in dose, call your health care professional. Women should inform their doctor if they wish to become pregnant or think they might be pregnant. There is a potential for serious side effects to an unborn child. Talk to your health care professional or pharmacist for more information. Women who become pregnant while using this medicine may enroll in the [place] American Antiepileptic Drug Pregnancy Registry by calling 1-888-233-2334. This registry collects information about the safety of antiepileptic drug use during pregnancy. This medicine may cause a decrease in folic acid and vitamin D. You should make sure that you get enough vitamins while you are taking this medicine. Discuss the foods you eat and the vitamins you take with your health care professional. What side effects may I notice from receiving this medicine? Side effects that you should report to your doctor or health care professional as soon as possible: -allergic reactions like skin rash, itching or hives, swelling of the face, lips, or tongue -changes in vision -redness, blistering, peeling or loosening of the skin, including inside the mouth -signs and symptoms of liver injury like dark yellow or brown urine; general ill feeling or flu-like symptoms; light-colored stools; loss of appetite; nausea; right upper belly pain; unusually weak or tired; yellowing of the eyes or skin -suicidal thoughts or other mood changes -unusual bleeding or bruising Side effects that usually do not require medical attention  (report to your doctor or health care professional if they continue or are bothersome): -constipation -diarrhea -dizziness -hair loss -headache -loss of appetite -weight gain This list may not describe all possible side effects. Call your doctor for medical advice about side effects. You may report side effects to FDA at 1-800-FDA-1088. Where should I keep my medicine? Keep out of reach of children. Store at room temperature between 15 and 30 degrees C (59 and 86 degrees F). Keep container tightly closed. Throw away any unused medicine after the expiration date. NOTE: This sheet is a summary. It may not cover all possible information. If you have questions about this medicine, talk to your doctor, pharmacist, or health care provider.  2019 Elsevier/Gold Standard (2017-03-01 10:48:27)  

## 2018-10-13 ENCOUNTER — Ambulatory Visit: Payer: 59 | Admitting: Psychiatry

## 2018-10-20 ENCOUNTER — Ambulatory Visit: Payer: 59 | Admitting: Psychiatry

## 2018-10-30 ENCOUNTER — Other Ambulatory Visit: Payer: Self-pay | Admitting: Psychiatry

## 2018-10-30 DIAGNOSIS — F419 Anxiety disorder, unspecified: Secondary | ICD-10-CM

## 2018-11-20 ENCOUNTER — Other Ambulatory Visit: Payer: Self-pay | Admitting: Psychiatry

## 2018-11-20 DIAGNOSIS — F4310 Post-traumatic stress disorder, unspecified: Secondary | ICD-10-CM

## 2018-12-22 ENCOUNTER — Other Ambulatory Visit: Payer: Self-pay | Admitting: Psychiatry

## 2018-12-22 DIAGNOSIS — F5105 Insomnia due to other mental disorder: Secondary | ICD-10-CM

## 2018-12-22 DIAGNOSIS — F4310 Post-traumatic stress disorder, unspecified: Secondary | ICD-10-CM

## 2018-12-22 NOTE — Telephone Encounter (Signed)
Patient needs appointment prior to future refills

## 2018-12-27 NOTE — Telephone Encounter (Signed)
Ok thanks for letting me know. We can mail it to her or she can pick up.

## 2018-12-27 NOTE — Telephone Encounter (Signed)
pt called states she wants a copy of her genesight testing but i dont see it scaned into her chart . can you look in your genesight portal and see if you can put it up again.

## 2019-06-12 ENCOUNTER — Other Ambulatory Visit: Payer: Self-pay | Admitting: Psychiatry

## 2019-06-12 DIAGNOSIS — F4310 Post-traumatic stress disorder, unspecified: Secondary | ICD-10-CM

## 2019-06-12 DIAGNOSIS — F5105 Insomnia due to other mental disorder: Secondary | ICD-10-CM

## 2019-08-25 ENCOUNTER — Ambulatory Visit: Payer: 59 | Admitting: Certified Nurse Midwife

## 2019-08-25 ENCOUNTER — Encounter: Payer: Self-pay | Admitting: Certified Nurse Midwife

## 2019-08-25 NOTE — Progress Notes (Deleted)
39 y.o. [id number] Married  {Race/ethnicity:17218} Fe here for annual exam.    No LMP recorded.          Sexually active: {yes no:314532}  The current method of family planning is vasectomy.    Exercising: {yes no:314532}  {types:19826} Smoker:  {YES NO:22349}  ROS  Health Maintenance: Pap:  08-24-2018 LSGIL HPV HR neg History of Abnormal Pap: yes MMG:  none Self Breast exams: {YES NO:22349} Colonoscopy:  none BMD:   none TDaP:  2018 Shingles: no Pneumonia: no Hep C and HIV: done per patient Labs: ***   reports that she has been smoking cigarettes. She has never used smokeless tobacco. She reports current alcohol use of about 1.0 - 2.0 standard drinks of alcohol per week. She reports previous drug use.  Past Medical History:  Diagnosis Date  . Anxiety   . Depression   . Migraines   . PTSD (post-traumatic stress disorder)   . Substance abuse ([place])   . Urinary incontinence     Past Surgical History:  Procedure Laterality Date  . AUGMENTATION MAMMAPLASTY      Current Outpatient Medications  Medication Sig Dispense Refill  . divalproex (DEPAKOTE ER) 250 MG 24 hr tablet TAKE 3 TABLETS (750 MG TOTAL) BY MOUTH EVERY EVENING. 90 tablet 1  . fluconazole (DIFLUCAN) 150 MG tablet Take one tablet today and repeat the second one tablet in 5 days 2 tablet 0  . propranolol (INDERAL) 10 MG tablet TAKE 1 TABLET (10 MG TOTAL) BY MOUTH 3 (THREE) TIMES DAILY AS NEEDED. SEVERE ANXIETY 90 tablet 1  . QUEtiapine (SEROQUEL) 50 MG tablet TAKE 1 TABLET (50 MG TOTAL) BY MOUTH AT BEDTIME. FOR MOOD AND SLEEP 30 tablet 0  . Vilazodone HCl (VIIBRYD) 40 MG TABS Take 1 tablet (40 mg total) by mouth daily. 90 tablet 0   No current facility-administered medications for this visit.    Family History  Problem Relation Age of Onset  . Lymphoma Mother        times 2  . Bipolar disorder Father   . Drug abuse Father   . Alcohol abuse Father   . Diabetes Father   . Hypertension Father   . Stroke Father    . Drug abuse Paternal Uncle   . Alcohol abuse Paternal Grandfather   . Anxiety disorder Paternal Grandmother   . Thyroid disease Paternal Grandmother   . Thyroid cancer Paternal Grandmother   . Diabetes Maternal Grandmother   . Alcohol abuse Maternal Grandfather   . Colon cancer Paternal Uncle     ROS:  Pertinent items are noted in HPI.  Otherwise, a comprehensive ROS was negative.  Exam:   There were no vitals taken for this visit.   Ht Readings from Last 3 Encounters:  08/23/18 5' 4.25" (1.632 m)  04/25/18 5\' 4"  (1.626 m)    General appearance: alert, cooperative and appears stated age Head: Normocephalic, without obvious abnormality, atraumatic Neck: no adenopathy, supple, symmetrical, trachea midline and thyroid {EXAM; THYROID:18604} Lungs: clear to auscultation bilaterally Breasts: {Exam; breast:13139::"normal appearance, no masses or tenderness"} Heart: regular rate and rhythm Abdomen: soft, non-tender; no masses,  no organomegaly Extremities: extremities normal, atraumatic, no cyanosis or edema Skin: Skin color, texture, turgor normal. No rashes or lesions Lymph nodes: Cervical, supraclavicular, and axillary nodes normal. No abnormal inguinal nodes palpated Neurologic: Grossly normal   Pelvic: External genitalia:  no lesions              Urethra:  normal appearing  urethra with no masses, tenderness or lesions              Bartholin's and Skene's: normal                 Vagina: normal appearing vagina with normal color and discharge, no lesions              Cervix: {exam; cervix:14595}              Pap taken: {yes no:314532} Bimanual Exam:  Uterus:  {exam; uterus:12215}              Adnexa: {exam; adnexa:12223}               Rectovaginal: Confirms               Anus:  normal sphincter tone, no lesions  Chaperone present: ***  A:  Well Woman with normal exam  P:   Reviewed health and wellness pertinent to exam  Pap smear: {YES NO:22349}  {plan;  gyn:5269::"mammogram","pap smear","return annually or prn"}  An After Visit Summary was printed and given to the patient.

## 2019-09-01 ENCOUNTER — Ambulatory Visit: Payer: 59

## 2019-09-07 ENCOUNTER — Ambulatory Visit: Payer: 59 | Admitting: Certified Nurse Midwife

## 2019-09-08 ENCOUNTER — Encounter: Payer: Self-pay | Admitting: Certified Nurse Midwife

## 2019-09-26 ENCOUNTER — Telehealth: Payer: Self-pay | Admitting: *Deleted

## 2019-09-26 NOTE — Telephone Encounter (Signed)
Message left to return call to Joanna Lin at [phone].   Patient in 08 recall for 03/21. Patient needs to schedule aex.  

## 2019-12-05 NOTE — Telephone Encounter (Signed)
Message left to return call to Marissa Lowrey at [phone].    

## 2019-12-06 NOTE — Telephone Encounter (Signed)
Patient returned call. Advised patient of need to schedule aex for repeat pap smear and HPV. Patient scheduled for 12-29-19 at 1530. Patient agreeable to date and time of appointment. Patient states will discuss concerns of abnormal pap from last year at appointment with Dr. Oscar La.  Routing to provider and will close encounter.

## 2019-12-29 ENCOUNTER — Encounter: Payer: Self-pay | Admitting: Obstetrics and Gynecology

## 2019-12-29 ENCOUNTER — Ambulatory Visit: Payer: 59 | Admitting: Obstetrics and Gynecology

## 2019-12-29 ENCOUNTER — Telehealth: Payer: Self-pay

## 2019-12-29 NOTE — Telephone Encounter (Signed)
Patient did not keep appointment for today.  °

## 2019-12-29 NOTE — Progress Notes (Deleted)
39 y.o. [id number] Married White or Caucasian Not Hispanic or Latino female here for annual exam.      No LMP recorded.          Sexually active: {yes no:314532}  The current method of family planning is {contraception:315051}.    Exercising: {yes no:314532}  {types:19826} Smoker:  {YES [id number]  Health Maintenance: Pap:  08/24/18 LISIL HPV Neg  History of abnormal Pap:  yes MMG:  None  BMD:   Never  Colonoscopy: Never  TDaP:  01/07/17  Gardasil: no   reports that she has been smoking cigarettes. She has never used smokeless tobacco. She reports current alcohol use of about 1.0 - 2.0 standard drink of alcohol per week. She reports previous drug use.  Past Medical History:  Diagnosis Date  . Anxiety   . Depression   . Migraines   . PTSD (post-traumatic stress disorder)   . Substance abuse (HCC)   . Urinary incontinence     Past Surgical History:  Procedure Laterality Date  . AUGMENTATION MAMMAPLASTY      Current Outpatient Medications  Medication Sig Dispense Refill  . divalproex (DEPAKOTE ER) 250 MG 24 hr tablet TAKE 3 TABLETS (750 MG TOTAL) BY MOUTH EVERY EVENING. 90 tablet 1  . fluconazole (DIFLUCAN) 150 MG tablet Take one tablet today and repeat the second one tablet in 5 days 2 tablet 0  . propranolol (INDERAL) 10 MG tablet TAKE 1 TABLET (10 MG TOTAL) BY MOUTH 3 (THREE) TIMES DAILY AS NEEDED. SEVERE ANXIETY 90 tablet 1  . QUEtiapine (SEROQUEL) 50 MG tablet TAKE 1 TABLET (50 MG TOTAL) BY MOUTH AT BEDTIME. FOR MOOD AND SLEEP 30 tablet 0  . Vilazodone HCl (VIIBRYD) 40 MG TABS Take 1 tablet (40 mg total) by mouth daily. 90 tablet 0   No current facility-administered medications for this visit.    Family History  Problem Relation Age of Onset  . Lymphoma Mother        times 2  . Bipolar disorder Father   . Drug abuse Father   . Alcohol abuse Father   . Diabetes Father   . Hypertension Father   . Stroke Father   . Drug abuse Paternal Uncle   . Alcohol abuse Paternal  Grandfather   . Anxiety disorder Paternal Grandmother   . Thyroid disease Paternal Grandmother   . Thyroid cancer Paternal Grandmother   . Diabetes Maternal Grandmother   . Alcohol abuse Maternal Grandfather   . Colon cancer Paternal Uncle     Review of Systems  Exam:   There were no vitals taken for this visit.  Weight change: @WEIGHTCHANGE @ Height:      Ht Readings from Last 3 Encounters:  08/23/18 5' 4.25" (1.632 m)  04/25/18 5\' 4"  (1.626 m)    General appearance: alert, cooperative and appears stated age Head: Normocephalic, without obvious abnormality, atraumatic Neck: no adenopathy, supple, symmetrical, trachea midline and thyroid {CHL AMB PHY EX THYROID NORM [phone]::"normal to inspection and palpation"} Lungs: clear to auscultation bilaterally Cardiovascular: regular rate and rhythm Breasts: {Exam; breast:13139::"normal appearance, no masses or tenderness"} Abdomen: soft, non-tender; non distended,  no masses,  no organomegaly Extremities: extremities normal, atraumatic, no cyanosis or edema Skin: Skin color, texture, turgor normal. No rashes or lesions Lymph nodes: Cervical, supraclavicular, and axillary nodes normal. No abnormal inguinal nodes palpated Neurologic: Grossly normal   Pelvic: External genitalia:  no lesions              Urethra:  normal  appearing urethra with no masses, tenderness or lesions              Bartholins and Skenes: normal                 Vagina: normal appearing vagina with normal color and discharge, no lesions              Cervix: {CHL AMB PHY EX CERVIX NORM [phone]::"no lesions"}               Bimanual Exam:  Uterus:  {CHL AMB PHY EX UTERUS NORM [phone]::"normal size, contour, position, consistency, mobility, non-tender"}              Adnexa: {CHL AMB PHY EX ADNEXA NO MASS DEFAULT:[phone]::"no mass, fullness, tenderness"}               Rectovaginal: Confirms               Anus:  normal sphincter tone,  no lesions  *** chaperoned for the exam.  A:  Well Woman with normal exam  P:

## 2020-11-29 ENCOUNTER — Emergency Department
Admission: EM | Admit: 2020-11-29 | Discharge: 2020-11-29 | Disposition: A | Discharge status: Home / Self Care | Payer: 59 | Attending: Emergency Medicine | Admitting: Emergency Medicine

## 2020-11-29 ENCOUNTER — Other Ambulatory Visit: Payer: Self-pay

## 2020-11-29 ENCOUNTER — Emergency Department: Payer: 59

## 2020-11-29 DIAGNOSIS — F17210 Nicotine dependence, cigarettes, uncomplicated: Secondary | ICD-10-CM | POA: Insufficient documentation

## 2020-11-29 DIAGNOSIS — R059 Cough, unspecified: Secondary | ICD-10-CM | POA: Diagnosis present

## 2020-11-29 DIAGNOSIS — Z20822 Contact with and (suspected) exposure to covid-19: Secondary | ICD-10-CM | POA: Diagnosis not present

## 2020-11-29 DIAGNOSIS — J069 Acute upper respiratory infection, unspecified: Secondary | ICD-10-CM | POA: Insufficient documentation

## 2020-11-29 MED ORDER — HYDROCOD POLST-CPM POLST ER 10-8 MG/5ML PO SUER: 5.0000 mL | Freq: Two times a day (BID) | ORAL | 0 refills | Status: DC

## 2020-11-29 MED ORDER — BENZONATATE 100 MG PO CAPS: 200.0000 mg | ORAL_CAPSULE | Freq: Three times a day (TID) | ORAL | 0 refills | Status: DC | PRN

## 2020-11-29 NOTE — ED Triage Notes (Signed)
Pt has cough and congestion x 10 days with no relief of OTC meds. Pt advised her husband is here to be seen as well.  Pt is coughing up green mucus. Pt in NAD.

## 2020-11-29 NOTE — ED Provider Notes (Signed)
[facility] Emergency Department Provider Note   ____________________________________________   Event Date/Time   First MD Initiated Contact with Patient 11/29/20 1319     (approximate)  I have reviewed the triage vital signs and the nursing notes.   HISTORY  Chief Complaint Cough    HPI Joanna Lin is a 40 y.o. female patient presents with cough and congestion for 10 days with no relief over-the-counter medication.  Patient's pulse is here with the same complaint.  Patient denies recent travel or known contact with COVID-19.  Patient has taken both COVID vaccines with booster.  Patient has not taken the flu vaccine.      Past Medical History:  Diagnosis Date   Anxiety    Depression    Migraines    PTSD (post-traumatic stress disorder)    Substance abuse (HCC)    Urinary incontinence     Patient Active Problem List   Diagnosis Date Noted   Well female exam with routine gynecological exam 08/24/2018   Cannabis abuse 07/19/2018   PTSD (post-traumatic stress disorder) 07/19/2018   Anxiety state 07/13/2018   Insomnia 07/13/2018    Past Surgical History:  Procedure Laterality Date   AUGMENTATION MAMMAPLASTY      Prior to Admission medications   Medication Sig Start Date End Date Taking? Authorizing Provider  benzonatate (TESSALON PERLES) 100 MG capsule Take 2 capsules (200 mg total) by mouth 3 (three) times daily as needed. 11/29/20 11/29/21 Yes Joni Reining, PA-C  chlorpheniramine-HYDROcodone (TUSSIONEX PENNKINETIC ER) 10-8 MG/5ML SUER Take 5 mLs by mouth 2 (two) times daily. 11/29/20  Yes Joni Reining, PA-C  divalproex (DEPAKOTE ER) 250 MG 24 hr tablet TAKE 3 TABLETS (750 MG TOTAL) BY MOUTH EVERY EVENING. 11/21/18   Jomarie Longs, MD  fluconazole (DIFLUCAN) 150 MG tablet Take one tablet today and repeat the second one tablet in 5 days 08/23/18   Verner Chol, CNM  propranolol (INDERAL) 10 MG tablet TAKE 1 TABLET (10 MG TOTAL) BY  MOUTH 3 (THREE) TIMES DAILY AS NEEDED. SEVERE ANXIETY 10/31/18   Jomarie Longs, MD  QUEtiapine (SEROQUEL) 50 MG tablet TAKE 1 TABLET (50 MG TOTAL) BY MOUTH AT BEDTIME. FOR MOOD AND SLEEP 12/22/18   Jomarie Longs, MD  Vilazodone HCl (VIIBRYD) 40 MG TABS Take 1 tablet (40 mg total) by mouth daily. 09/26/18   Jomarie Longs, MD    Allergies Penicillins and Hydrocodone  Family History  Problem Relation Age of Onset   Lymphoma Mother        times 2   Bipolar disorder Father    Drug abuse Father    Alcohol abuse Father    Diabetes Father    Hypertension Father    Stroke Father    Drug abuse Paternal Uncle    Alcohol abuse Paternal Grandfather    Anxiety disorder Paternal Grandmother    Thyroid disease Paternal Grandmother    Thyroid cancer Paternal Grandmother    Diabetes Maternal Grandmother    Alcohol abuse Maternal Grandfather    Colon cancer Paternal Uncle     Social History Social History   Tobacco Use   Smoking status: Every Day    Pack years: 0.00    Types: Cigarettes   Smokeless tobacco: Never  Vaping Use   Vaping Use: Former  Substance Use Topics   Alcohol use: Yes    Alcohol/week: 1.0 - 2.0 standard drink    Types: 1 - 2 Standard drinks or equivalent per week  Drug use: Not Currently    Review of Systems  Constitutional: No fever/chills Eyes: No visual changes. ENT: No sore throat. Cardiovascular: Denies chest pain. Respiratory: Denies shortness of breath.  Nonproductive cough. Gastrointestinal: No abdominal pain.  No nausea, no vomiting.  No diarrhea.  No constipation. Genitourinary: Negative for dysuria. Musculoskeletal: Negative for back pain. Skin: Negative for rash. Neurological: Negative for headaches, focal weakness or numbness. Psychiatric: Anxiety, depression, PTSD. Allergic/Immunilogical: Penicillin hydrocodone.  ____________________________________________   PHYSICAL EXAM:  VITAL SIGNS: ED Triage Vitals  Enc Vitals Group     BP  11/29/20 1308 (!) 149/111     Pulse Rate 11/29/20 1308 90     Resp 11/29/20 1308 16     Temp 11/29/20 1308 98.4 F (36.9 C)     Temp Source 11/29/20 1308 Oral     SpO2 11/29/20 1308 97 %     Weight 11/29/20 1309 185 lb (83.9 kg)     Height 11/29/20 1309 5\' 4"  (1.626 m)     Head Circumference --      Peak Flow --      Pain Score 11/29/20 1308 3     Pain Loc --      Pain Edu? --      Excl. in GC? --     Constitutional: Alert and oriented. Well appearing and in no acute distress. Eyes: Conjunctivae are normal. PERRL. EOMI. Head: Atraumatic. Nose: No congestion/rhinnorhea. Mouth/Throat: Mucous membranes are moist.  Oropharynx non-erythematous. Neck: No stridor.   Hematological/Lymphatic/Immunilogical: No cervical lymphadenopathy. Cardiovascular: Normal rate, regular rhythm. Grossly normal heart sounds.  Good peripheral circulation. Respiratory: Normal respiratory effort.  No retractions. Lungs CTAB. Gastrointestinal: Soft and nontender. No distention. No abdominal bruits. No CVA tenderness. Genitourinary: Deferred Musculoskeletal: No lower extremity tenderness nor edema.  No joint effusions. Neurologic:  Normal speech and language. No gross focal neurologic deficits are appreciated. No gait instability. Skin:  Skin is warm, dry and intact. No rash noted. Psychiatric: Mood and affect are normal. Speech and behavior are normal.  ____________________________________________   LABS (all labs ordered are listed, but only abnormal results are displayed)  Labs Reviewed  SARS CORONAVIRUS 2 (TAT 6-24 HRS)   ____________________________________________  EKG   ____________________________________________  RADIOLOGY I, 01/29/21, personally viewed and evaluated these images (plain radiographs) as part of my medical decision making, as well as reviewing the written report by the radiologist.  ED MD interpretation: No acute findings on chest x-ray.  Official radiology  report(s): DG Chest Portable 1 View  Result Date: 11/29/2020 CLINICAL DATA:  Cough and congestion for 10 days. No relief with over-the-counter medications. EXAM: PORTABLE CHEST 1 VIEW COMPARISON:  None. FINDINGS: 1332 hours. The heart size and mediastinal contours are normal. The lungs are clear. There is no pleural effusion or pneumothorax. No acute osseous findings are identified. IMPRESSION: No active cardiopulmonary process. Electronically Signed   By: 01/29/2021 M.D.   On: 11/29/2020 14:21    ____________________________________________   PROCEDURES  Procedure(s) performed (including Critical Care):  Procedures   ____________________________________________   INITIAL IMPRESSION / ASSESSMENT AND PLAN / ED COURSE  As part of my medical decision making, I reviewed the following data within the electronic MEDICAL RECORD NUMBER         Patient presents approximately 10 days intermitting productive and nonproductive cough.  Discussed no acute findings on chest x-ray.  Patient complaining physical exam consistent with viral respiratory infection with cough.  Patient advised that COVID-19 and flu shot  results are pending.  Patient given discharge care instructions and a prescription for Tessalon pearls to use during the day and Tussionex cough syrup for night.  Patient advised follow-up PCP.      ____________________________________________   FINAL CLINICAL IMPRESSION(S) / ED DIAGNOSES  Final diagnoses:  Viral URI with cough     ED Discharge Orders          Ordered    benzonatate (TESSALON PERLES) 100 MG capsule  3 times daily PRN        11/29/20 1517    chlorpheniramine-HYDROcodone (TUSSIONEX PENNKINETIC ER) 10-8 MG/5ML SUER  2 times daily        11/29/20 1517             Note:  This document was prepared using Dragon voice recognition software and may include unintentional dictation errors.    Joni Reining, PA-C 11/29/20 1524    Gilles Chiquito,  MD 12/01/20 [phone]

## 2020-11-29 NOTE — ED Notes (Signed)
See triage note  Presents with cough for the past 1 1/2 weeks  No fever  States cough is productive  Also having some nasal congestion and bilateral ear discomfort

## 2020-11-29 NOTE — Discharge Instructions (Addendum)
No acute findings on chest x-ray.  Your COVID-19 and influenza test results will be available later.  You may find his results in the MyChart app.  Read and follow discharge care instruction.  Take medication as directed.

## 2020-11-30 LAB — SARS CORONAVIRUS 2 (TAT 6-24 HRS): SARS Coronavirus 2: NEGATIVE

## 2021-01-19 ENCOUNTER — Other Ambulatory Visit: Payer: Self-pay

## 2021-01-19 ENCOUNTER — Emergency Department
Admission: EM | Admit: 2021-01-19 | Discharge: 2021-01-19 | Disposition: A | Discharge status: Home / Self Care | Payer: 59 | Attending: Emergency Medicine | Admitting: Emergency Medicine

## 2021-01-19 ENCOUNTER — Encounter: Payer: Self-pay | Admitting: Emergency Medicine

## 2021-01-19 DIAGNOSIS — F17210 Nicotine dependence, cigarettes, uncomplicated: Secondary | ICD-10-CM | POA: Insufficient documentation

## 2021-01-19 DIAGNOSIS — H6981 Other specified disorders of Eustachian tube, right ear: Secondary | ICD-10-CM

## 2021-01-19 DIAGNOSIS — H6991 Unspecified Eustachian tube disorder, right ear: Secondary | ICD-10-CM | POA: Diagnosis not present

## 2021-01-19 DIAGNOSIS — R6884 Jaw pain: Secondary | ICD-10-CM | POA: Diagnosis present

## 2021-01-19 DIAGNOSIS — M542 Cervicalgia: Secondary | ICD-10-CM | POA: Insufficient documentation

## 2021-01-19 MED ORDER — FLUTICASONE PROPIONATE 50 MCG/ACT NA SUSP: 2.0000 | Freq: Every day | NASAL | 0 refills | Status: AC

## 2021-01-19 MED ORDER — PREDNISONE 10 MG PO TABS: ORAL_TABLET | ORAL | 0 refills | Status: DC

## 2021-01-19 NOTE — Discharge Instructions (Addendum)
Follow-up with your primary care provider if any continued problems or Dr. Andee Poles who is on-call for [facility] if not improving.  Begin taking medication as directed.  The prednisone you will start today with 6 tablets and taper down by 1 tablet each day and the Flonase nasal sprays 2 sprays on each nostril once a day.  You may also obtain over-the-counter Sudafed.  Take Tylenol with this medication if needed for discomfort.  The steroids will help with pressure which will also help with pain.

## 2021-01-19 NOTE — ED Triage Notes (Signed)
Pt to ED via POV stating that she is having pain at right jaw line. Pt states that the pain started last night. She has not taken anything for pain. Wants to be evaluated. Pt is in NAD.

## 2021-01-19 NOTE — ED Provider Notes (Signed)
[facility] Emergency Department Provider Note  ____________________________________________   Event Date/Time   First MD Initiated Contact with Patient 01/19/21 [phone]     (approximate)  I have reviewed the triage vital signs and the nursing notes.   HISTORY  Chief Complaint Jaw Pain   HPI Joanna Lin is a 40 y.o. female presents to the ED with complaint of right-sided jaw pain that starts at the back of her jaw and goes down her neck.  Patient states the pain started last evening.  She denies any fever or chills.  She states that this is not involving her teeth.  Patient denies any injury to this area.  No over-the-counter medications has been tried for pain.  She rates her pain as 6 out of 10.         Past Medical History:  Diagnosis Date   Anxiety    Depression    Migraines    PTSD (post-traumatic stress disorder)    Substance abuse (HCC)    Urinary incontinence     Patient Active Problem List   Diagnosis Date Noted   Well female exam with routine gynecological exam 08/24/2018   Cannabis abuse 07/19/2018   PTSD (post-traumatic stress disorder) 07/19/2018   Anxiety state 07/13/2018   Insomnia 07/13/2018    Past Surgical History:  Procedure Laterality Date   AUGMENTATION MAMMAPLASTY      Prior to Admission medications   Medication Sig Start Date End Date Taking? Authorizing Provider  fluticasone (FLONASE) 50 MCG/ACT nasal spray Place 2 sprays into both nostrils daily. 01/19/21 01/19/22 Yes Jaelani Posa L, PA-C  predniSONE (DELTASONE) 10 MG tablet Take 6 tablets  today, on day 2 take 5 tablets, day 3 take 4 tablets, day 4 take 3 tablets, day 5 take  2 tablets and 1 tablet the last day 01/19/21  Yes Bridget Hartshorn L, PA-C  divalproex (DEPAKOTE ER) 250 MG 24 hr tablet TAKE 3 TABLETS (750 MG TOTAL) BY MOUTH EVERY EVENING. 11/21/18   Jomarie Longs, MD  propranolol (INDERAL) 10 MG tablet TAKE 1 TABLET (10 MG TOTAL) BY MOUTH 3 (THREE) TIMES  DAILY AS NEEDED. SEVERE ANXIETY 10/31/18   Jomarie Longs, MD  QUEtiapine (SEROQUEL) 50 MG tablet TAKE 1 TABLET (50 MG TOTAL) BY MOUTH AT BEDTIME. FOR MOOD AND SLEEP 12/22/18   Jomarie Longs, MD  Vilazodone HCl (VIIBRYD) 40 MG TABS Take 1 tablet (40 mg total) by mouth daily. 09/26/18   Jomarie Longs, MD    Allergies Penicillins and Hydrocodone  Family History  Problem Relation Age of Onset   Lymphoma Mother        times 2   Bipolar disorder Father    Drug abuse Father    Alcohol abuse Father    Diabetes Father    Hypertension Father    Stroke Father    Drug abuse Paternal Uncle    Alcohol abuse Paternal Grandfather    Anxiety disorder Paternal Grandmother    Thyroid disease Paternal Grandmother    Thyroid cancer Paternal Grandmother    Diabetes Maternal Grandmother    Alcohol abuse Maternal Grandfather    Colon cancer Paternal Uncle     Social History Social History   Tobacco Use   Smoking status: Every Day    Types: Cigarettes   Smokeless tobacco: Never  Vaping Use   Vaping Use: Former  Substance Use Topics   Alcohol use: Not Currently    Alcohol/week: 1.0 - 2.0 standard drink    Types:  1 - 2 Standard drinks or equivalent per week   Drug use: Not Currently    Review of Systems Constitutional: No fever/chills Eyes: No visual changes. ENT: No sore throat.  Right-sided neck and jaw pain.  Decreased hearing right ear. Cardiovascular: Denies chest pain. Respiratory: Denies shortness of breath.  Negative for cough. Gastrointestinal: No abdominal pain.  No nausea, no vomiting.  No diarrhea. Musculoskeletal: Negative for musculoskeletal pain. Skin: Negative for rash. Neurological: Negative for headaches, focal weakness or numbness.  ____________________________________________   PHYSICAL EXAM:  VITAL SIGNS: ED Triage Vitals  Enc Vitals Group     BP 01/19/21 0909 (!) 136/102     Pulse Rate 01/19/21 0909 77     Resp 01/19/21 0909 16     Temp 01/19/21 0909 98.1  F (36.7 C)     Temp Source 01/19/21 0909 Oral     SpO2 01/19/21 0909 96 %     Weight 01/19/21 0910 175 lb (79.4 kg)     Height 01/19/21 0910 5\' 4"  (1.626 m)     Head Circumference --      Peak Flow --      Pain Score 01/19/21 0910 6     Pain Loc --      Pain Edu? --      Excl. in GC? --     Constitutional: Alert and oriented. Well appearing and in no acute distress. Eyes: Conjunctivae are normal. PERRL. EOMI. Head: Atraumatic. Nose: No congestion/rhinnorhea. Ears: EACs are clear.  TMs bilaterally are dull and poor light reflex.  Mild fluid is noted on inspection of the right TM.  No erythema or injection is noted. Mouth/Throat: Mucous membranes are moist.  Oropharynx non-erythematous.  No exudate and uvula is midline. Neck: No stridor.  No cervical lymphadenopathy noted.  There is tenderness on palpation of the right lateral area without soft tissue edema, erythema or injury noted. Cardiovascular: Normal rate, regular rhythm. Grossly normal heart sounds.  Good peripheral circulation. Respiratory: Normal respiratory effort.  No retractions. Lungs CTAB. Musculoskeletal: Moves upper and lower extremities they have difficulty.  Normal gait was noted. Neurologic:  Normal speech and language. No gross focal neurologic deficits are appreciated. No gait instability. Skin:  Skin is warm, dry and intact. No rash noted. Psychiatric: Mood and affect are normal. Speech and behavior are normal.  ____________________________________________   LABS (all labs ordered are listed, but only abnormal results are displayed)  Labs Reviewed - No data to display ____________________________________________   PROCEDURES  Procedure(s) performed (including Critical Care):  Procedures   ____________________________________________   INITIAL IMPRESSION / ASSESSMENT AND PLAN / ED COURSE  As part of my medical decision making, I reviewed the following data within the electronic MEDICAL RECORD NUMBER  Notes from prior ED visits and [place] Controlled Substance Database  40 year old female presents to the ED with complaint of right sided facial pain that on exam has some tenderness down along the eustachian tube.  TM is dull with poor light reflex and when asked patient is unable to open the canal.  Hearing is muffled on the right.  Findings were consistent with eustachian tube dysfunction and patient was made aware.  She was placed on prednisone 6-day taper along with Flonase nasal spray.  She is aware that she can take Tylenol or ibuprofen with this medication for pain.  She is to follow-up with Dr. [id number] who is on-call for [facility] if she continues to have problems with her ear or hearing.  ____________________________________________   FINAL CLINICAL IMPRESSION(S) / ED DIAGNOSES  Final diagnoses:  Dysfunction of right eustachian tube     ED Discharge Orders          Ordered    predniSONE (DELTASONE) 10 MG tablet        01/19/21 0951    fluticasone (FLONASE) 50 MCG/ACT nasal spray  Daily        01/19/21 0951             Note:  This document was prepared using Dragon voice recognition software and may include unintentional dictation errors.    Tommi Rumps, PA-C 01/19/21 1158    Chesley Noon, MD 01/20/21 [phone]

## 2021-05-31 ENCOUNTER — Emergency Department: Payer: 59

## 2021-05-31 ENCOUNTER — Other Ambulatory Visit: Payer: Self-pay

## 2021-05-31 ENCOUNTER — Emergency Department
Admission: EM | Admit: 2021-05-31 | Discharge: 2021-05-31 | Disposition: A | Discharge status: Home / Self Care | Payer: 59 | Attending: Emergency Medicine | Admitting: Emergency Medicine

## 2021-05-31 DIAGNOSIS — Z20822 Contact with and (suspected) exposure to covid-19: Secondary | ICD-10-CM | POA: Insufficient documentation

## 2021-05-31 DIAGNOSIS — J069 Acute upper respiratory infection, unspecified: Secondary | ICD-10-CM | POA: Insufficient documentation

## 2021-05-31 DIAGNOSIS — F17210 Nicotine dependence, cigarettes, uncomplicated: Secondary | ICD-10-CM | POA: Insufficient documentation

## 2021-05-31 DIAGNOSIS — R0602 Shortness of breath: Secondary | ICD-10-CM | POA: Diagnosis present

## 2021-05-31 LAB — RESP PANEL BY RT-PCR (FLU A&B, COVID) ARPGX2
Influenza A by PCR: NEGATIVE
Influenza B by PCR: NEGATIVE
SARS Coronavirus 2 by RT PCR: NEGATIVE

## 2021-05-31 MED ORDER — BENZONATATE 100 MG PO CAPS: ORAL_CAPSULE | ORAL | 0 refills | Status: AC

## 2021-05-31 MED ORDER — ALBUTEROL SULFATE HFA 108 (90 BASE) MCG/ACT IN AERS: 2.0000 | INHALATION_SPRAY | Freq: Four times a day (QID) | RESPIRATORY_TRACT | 0 refills | Status: AC | PRN

## 2021-05-31 MED ORDER — PSEUDOEPH-BROMPHEN-DM 30-2-10 MG/5ML PO SYRP: 5.0000 mL | ORAL_SOLUTION | Freq: Four times a day (QID) | ORAL | 0 refills | Status: AC | PRN

## 2021-05-31 MED ORDER — PREDNISONE 20 MG PO TABS: 40.0000 mg | ORAL_TABLET | Freq: Every day | ORAL | 0 refills | Status: AC

## 2021-05-31 NOTE — Discharge Instructions (Signed)
Your symptoms are consistent with a viral infection. You Covid/influenza tests are pending. You may follow the results on [facility] MyChart. Take OTC Tylenol and Motrin as needed for fevers and bodyaches.

## 2021-05-31 NOTE — ED Provider Notes (Signed)
[facility] Emergency Department Provider Note ____________________________________________  Time seen: 1807  I have reviewed the triage vital signs and the nursing notes.  HISTORY  Chief Complaint  Cough and Nasal Congestion  HPI Joanna Lin is a 40 y.o. female presents to the ED with 3 days of viral symptoms including subjective fevers, shortness of breath, cough, and fatigue.  She denies any frank nausea vomiting or diarrhea.  She is been taking over-the-counter medicines with limited benefit.  Past Medical History:  Diagnosis Date   Anxiety    Depression    Migraines    PTSD (post-traumatic stress disorder)    Substance abuse (HCC)    Urinary incontinence     Patient Active Problem List   Diagnosis Date Noted   Well female exam with routine gynecological exam 08/24/2018   Cannabis abuse 07/19/2018   PTSD (post-traumatic stress disorder) 07/19/2018   Anxiety state 07/13/2018   Insomnia 07/13/2018    Past Surgical History:  Procedure Laterality Date   AUGMENTATION MAMMAPLASTY      Prior to Admission medications   Medication Sig Start Date End Date Taking? Authorizing Provider  albuterol (VENTOLIN HFA) 108 (90 Base) MCG/ACT inhaler Inhale 2 puffs into the lungs every 6 (six) hours as needed for shortness of breath. 05/31/21  Yes Kobe Jansma, Charlesetta Ivory, PA-C  benzonatate (TESSALON PERLES) 100 MG capsule Take 1-2 tabs TID prn cough 05/31/21  Yes Ivannia Willhelm, Charlesetta Ivory, PA-C  brompheniramine-pseudoephedrine-DM 30-2-10 MG/5ML syrup Take 5 mLs by mouth 4 (four) times daily as needed. 05/31/21  Yes Alegandro Macnaughton, Charlesetta Ivory, PA-C  predniSONE (DELTASONE) 20 MG tablet Take 2 tablets (40 mg total) by mouth daily with breakfast for 5 days. 05/31/21 06/05/21 Yes Callee Rohrig, Charlesetta Ivory, PA-C  divalproex (DEPAKOTE ER) 250 MG 24 hr tablet TAKE 3 TABLETS (750 MG TOTAL) BY MOUTH EVERY EVENING. 11/21/18   Jomarie Longs, MD  fluticasone (FLONASE) 50 MCG/ACT nasal  spray Place 2 sprays into both nostrils daily. 01/19/21 01/19/22  Tommi Rumps, PA-C  propranolol (INDERAL) 10 MG tablet TAKE 1 TABLET (10 MG TOTAL) BY MOUTH 3 (THREE) TIMES DAILY AS NEEDED. SEVERE ANXIETY 10/31/18   Jomarie Longs, MD  QUEtiapine (SEROQUEL) 50 MG tablet TAKE 1 TABLET (50 MG TOTAL) BY MOUTH AT BEDTIME. FOR MOOD AND SLEEP 12/22/18   Jomarie Longs, MD  Vilazodone HCl (VIIBRYD) 40 MG TABS Take 1 tablet (40 mg total) by mouth daily. 09/26/18   Jomarie Longs, MD    Allergies Penicillins and Hydrocodone  Family History  Problem Relation Age of Onset   Lymphoma Mother        times 2   Bipolar disorder Father    Drug abuse Father    Alcohol abuse Father    Diabetes Father    Hypertension Father    Stroke Father    Drug abuse Paternal Uncle    Alcohol abuse Paternal Grandfather    Anxiety disorder Paternal Grandmother    Thyroid disease Paternal Grandmother    Thyroid cancer Paternal Grandmother    Diabetes Maternal Grandmother    Alcohol abuse Maternal Grandfather    Colon cancer Paternal Uncle     Social History Social History   Tobacco Use   Smoking status: Every Day    Types: Cigarettes   Smokeless tobacco: Never  Vaping Use   Vaping Use: Former  Substance Use Topics   Alcohol use: Not Currently    Alcohol/week: 1.0 - 2.0 standard drink    Types:  1 - 2 Standard drinks or equivalent per week   Drug use: Not Currently    Review of Systems  Constitutional: Positive for fever. Eyes: Negative for visual changes. ENT: Negative for sore throat. Cardiovascular: Negative for chest pain. Respiratory: Negative for shortness of breath.  Reports cough as above Gastrointestinal: Negative for abdominal pain, vomiting and diarrhea. Genitourinary: Negative for dysuria. Musculoskeletal: Negative for back pain. Skin: Negative for rash. Neurological: Negative for headaches, focal weakness or numbness. ____________________________________________  PHYSICAL  EXAM:  VITAL SIGNS: ED Triage Vitals  Enc Vitals Group     BP 05/31/21 1534 (!) 142/100     Pulse Rate 05/31/21 1534 90     Resp 05/31/21 1534 20     Temp 05/31/21 1534 (!) 97.5 F (36.4 C)     Temp Source 05/31/21 1534 Oral     SpO2 05/31/21 1534 98 %     Weight 05/31/21 1533 170 lb (77.1 kg)     Height 05/31/21 1533 5\' 4"  (1.626 m)     Head Circumference --      Peak Flow --      Pain Score 05/31/21 1533 0     Pain Loc --      Pain Edu? --      Excl. in [place]? --     Constitutional: Alert and oriented. Well appearing and in no distress. Head: Normocephalic and atraumatic. Eyes: Conjunctivae are normal. Normal extraocular movements Cardiovascular: Normal rate, regular rhythm. Normal distal pulses. Respiratory: Normal respiratory effort. No rales/rhonchi.  Mild end expiratory wheeze noted bilaterally. Gastrointestinal: Soft and nontender. No distention. Musculoskeletal: Nontender with normal range of motion in all extremities.  Neurologic:  Normal gait without ataxia. Normal speech and language. No gross focal neurologic deficits are appreciated. Skin:  Skin is warm, dry and intact. No rash noted. Psychiatric: Mood and affect are normal. Patient exhibits appropriate insight and judgment. ____________________________________________    {LABS (pertinent positives/negatives)  Labs Reviewed  RESP PANEL BY RT-PCR (FLU A&B, COVID) [id number]  ____________________________________________  {EKG  ____________________________________________   RADIOLOGY Official radiology report(s): DG Chest 2 View  Result Date: 05/31/2021 CLINICAL DATA:  Cough EXAM: CHEST - 2 VIEW COMPARISON:  11/29/2020 FINDINGS: The heart size and mediastinal contours are within normal limits. Both lungs are clear. The visualized skeletal structures are unremarkable. IMPRESSION: No active cardiopulmonary disease. Electronically Signed   By: 01/29/2021 M.D.   On: 05/31/2021 16:11    ____________________________________________  PROCEDURES   Procedures ____________________________________________   INITIAL IMPRESSION / ASSESSMENT AND PLAN / ED COURSE  As part of my medical decision making, I reviewed the following data within the electronic MEDICAL RECORD NUMBER Labs reviewed pending, Radiograph reviewed NAD, and Notes from prior ED visits   DDX: covid, influenza, RSV, viral URI  Patient ED evaluation of several days of flulike symptoms including subjective fevers, cough, congestion, and fatigue.  She is evaluated for complaints and found to have overall reassuring clinical picture.  No signs of acute respiratory stress, hypoxia, or dehydration.  Chest x-ray does not reveal any acute intrathoracic process.  Has a viral panel screen that is pending at the time of this disposition.  She is however stable for discharge with instructions to manage her symptoms with Tylenol and Motrin, as well as prescriptions for prednisone, albuterol, Tessalon Perles, and Bromfed syrup.  She will continue to rest and hydrate and remain out of work for the remainder of this weekend.  Return precautions of been reviewed.  14/03/2021  was evaluated in Emergency Department on 05/31/2021 for the symptoms described in the history of present illness. She was evaluated in the context of the global COVID-19 pandemic, which necessitated consideration that the patient might be at risk for infection with the SARS-CoV-2 virus that causes COVID-19. Institutional protocols and algorithms that pertain to the evaluation of patients at risk for COVID-19 are in a state of rapid change based on information released by regulatory bodies including the CDC and federal and state organizations. These policies and algorithms were followed during the patient's care in the ED. ____________________________________________  FINAL CLINICAL IMPRESSION(S) / ED DIAGNOSES  Final diagnoses:  Viral upper respiratory tract  infection      Karmen Stabs, Charlesetta Ivory, PA-C 05/31/21 Dereck Ligas, MD 05/31/21 [phone]

## 2021-05-31 NOTE — ED Notes (Addendum)
See triage note. Pt ambulatory to room. Pt c/o congested, cough and fevers since Wednesday.

## 2021-05-31 NOTE — ED Triage Notes (Signed)
Pt states that she has been feeling congested, cough, fevers since Wednesday- pt states the congestion makes hard to get a deep breath

## 2021-07-17 IMAGING — DX DG CHEST 1V PORT
1 series · 1 of 1 positions shown · non-contrast
Comparison: None.

CLINICAL DATA: Cough and congestion for 10 days. No relief with
over-the-counter medications.

EXAM:
PORTABLE CHEST 1 VIEW

[chest ap]
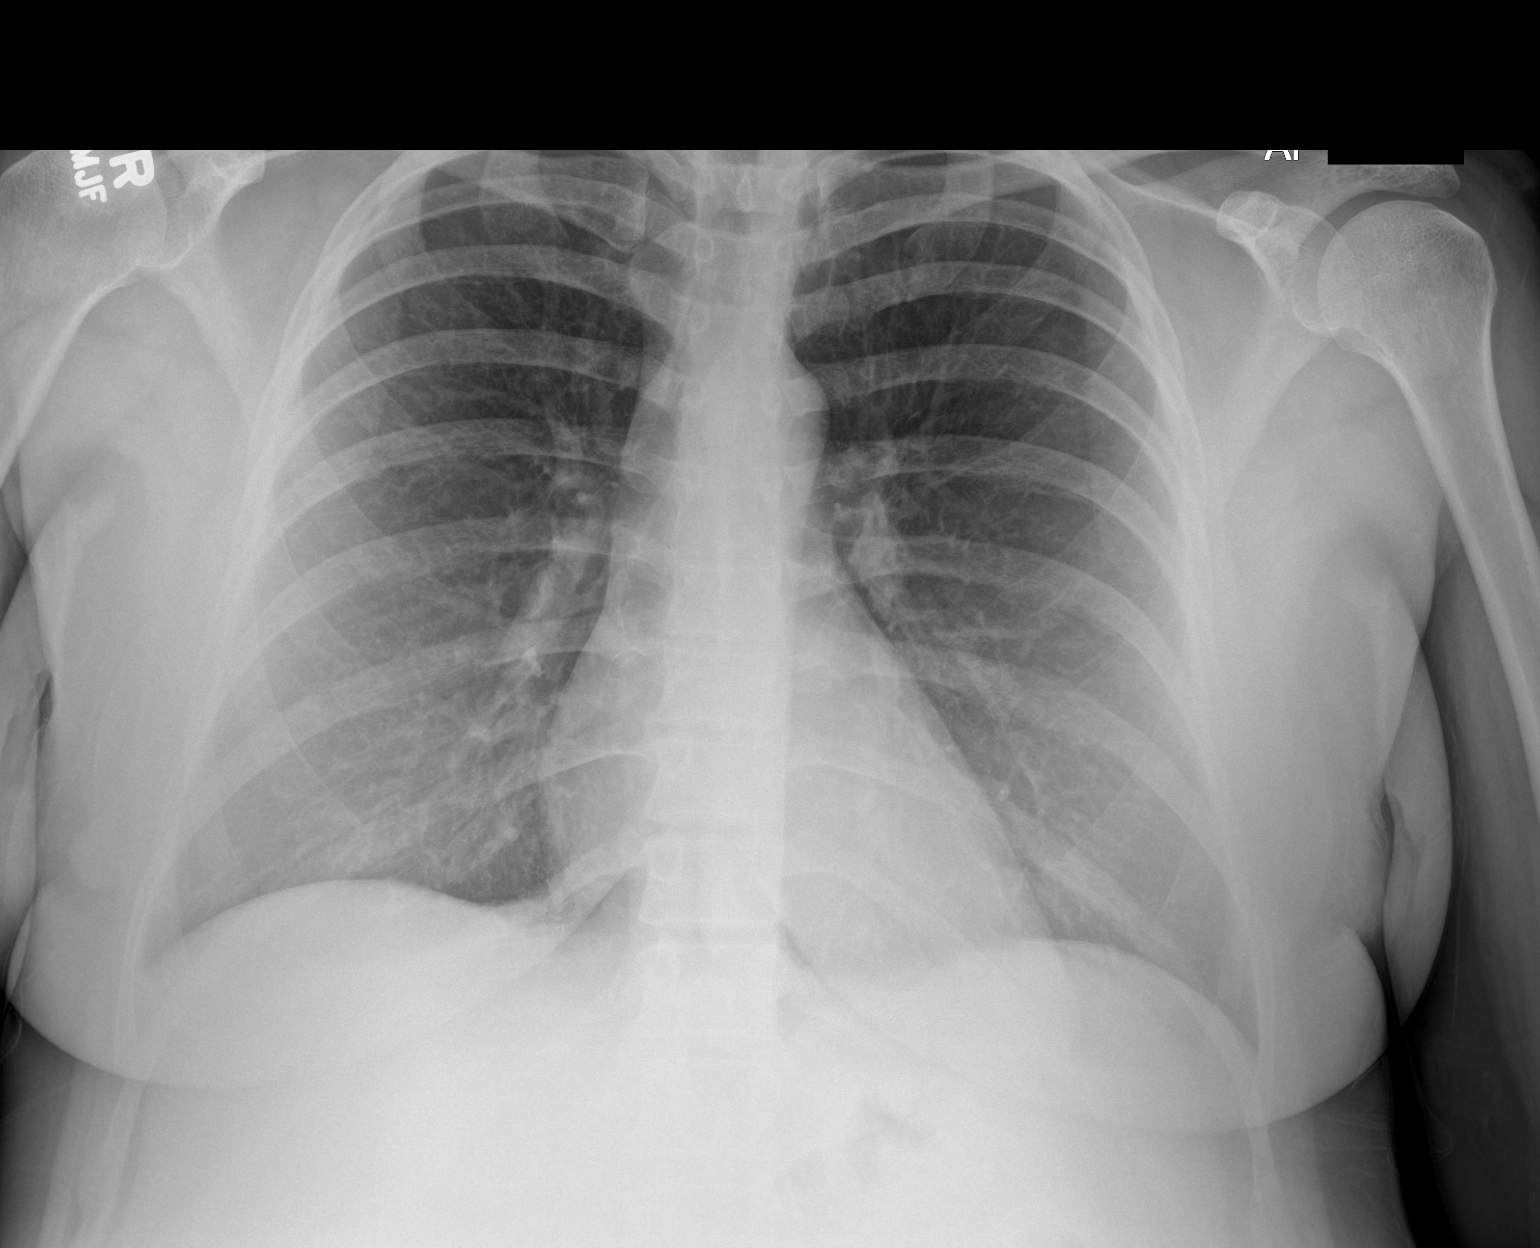

[1 of 1 positions shown; findings below may reference images not displayed]

FINDINGS: 6776 hours. The heart size and mediastinal contours are normal. The
lungs are clear. There is no pleural effusion or pneumothorax. No
acute osseous findings are identified.
IMPRESSION: No active cardiopulmonary process.

## 2021-09-17 ENCOUNTER — Encounter: Payer: Self-pay | Admitting: Emergency Medicine

## 2021-09-17 ENCOUNTER — Emergency Department
Admission: EM | Admit: 2021-09-17 | Discharge: 2021-09-17 | Disposition: A | Discharge status: Home / Self Care | Payer: 59 | Attending: Emergency Medicine | Admitting: Emergency Medicine

## 2021-09-17 ENCOUNTER — Other Ambulatory Visit: Payer: Self-pay

## 2021-09-17 ENCOUNTER — Emergency Department: Payer: 59

## 2021-09-17 DIAGNOSIS — R221 Localized swelling, mass and lump, neck: Secondary | ICD-10-CM | POA: Diagnosis not present

## 2021-09-17 DIAGNOSIS — F17210 Nicotine dependence, cigarettes, uncomplicated: Secondary | ICD-10-CM | POA: Insufficient documentation

## 2021-09-17 DIAGNOSIS — L089 Local infection of the skin and subcutaneous tissue, unspecified: Secondary | ICD-10-CM

## 2021-09-17 LAB — CBC WITH DIFFERENTIAL/PLATELET
Abs Immature Granulocytes: 0.04 10*3/uL (ref 0.00–0.07)
Basophils Absolute: 0.1 10*3/uL (ref 0.0–0.1)
Basophils Relative: 1 %
Eosinophils Absolute: 0.3 10*3/uL (ref 0.0–0.5)
Eosinophils Relative: 2 %
HCT: 38.7 % (ref 36.0–46.0)
Hemoglobin: 13 g/dL (ref 12.0–15.0)
Immature Granulocytes: 0 %
Lymphocytes Relative: 13 %
Lymphs Abs: 1.7 10*3/uL (ref 0.7–4.0)
MCH: 30.1 pg (ref 26.0–34.0)
MCHC: 33.6 g/dL (ref 30.0–36.0)
MCV: 89.6 fL (ref 80.0–100.0)
Monocytes Absolute: 0.7 10*3/uL (ref 0.1–1.0)
Monocytes Relative: 5 %
Neutro Abs: 10.2 10*3/uL — ABNORMAL HIGH (ref 1.7–7.7)
Neutrophils Relative %: 79 %
Platelets: 352 10*3/uL (ref 150–400)
RBC: 4.32 MIL/uL (ref 3.87–5.11)
RDW: 14.1 % (ref 11.5–15.5)
WBC: 13 10*3/uL — ABNORMAL HIGH (ref 4.0–10.5)
nRBC: 0 % (ref 0.0–0.2)

## 2021-09-17 LAB — BASIC METABOLIC PANEL
Anion gap: 8 (ref 5–15)
BUN: 8 mg/dL (ref 6–20)
CO2: 22 mmol/L (ref 22–32)
Calcium: 9.1 mg/dL (ref 8.9–10.3)
Chloride: 106 mmol/L (ref 98–111)
Creatinine, Ser: 0.64 mg/dL (ref 0.44–1.00)
GFR, Estimated: >60 mL/min (ref >60)
Glucose, Bld: 93 mg/dL (ref 70–99)
Potassium: 3.9 mmol/L (ref 3.5–5.1)
Sodium: 136 mmol/L (ref 135–145)

## 2021-09-17 LAB — T4, FREE: Free T4: 1.2 ng/dL — ABNORMAL HIGH (ref 0.61–1.12)

## 2021-09-17 LAB — TSH: TSH: 1.109 u[IU]/mL (ref 0.350–4.500)

## 2021-09-17 MED ORDER — SODIUM CHLORIDE 0.9 % IV BOLUS
1000.0000 mL | Freq: Once | INTRAVENOUS | Status: AC
  Administered 2021-09-17: 1000 mL via INTRAVENOUS

## 2021-09-17 MED ORDER — DOXYCYCLINE HYCLATE 100 MG PO CAPS: 100.0000 mg | ORAL_CAPSULE | Freq: Two times a day (BID) | ORAL | 0 refills | Status: AC

## 2021-09-17 MED ORDER — IOHEXOL 300 MG/ML  SOLN
75.0000 mL | Freq: Once | INTRAMUSCULAR | Status: AC | PRN
  Administered 2021-09-17: 75 mL via INTRAVENOUS

## 2021-09-17 MED ORDER — ONDANSETRON 4 MG PO TBDP
4.0000 mg | ORAL_TABLET | Freq: Once | ORAL | Status: AC
  Administered 2021-09-17: 4 mg via ORAL
  Filled 2021-09-17: qty 1

## 2021-09-17 MED ORDER — ONDANSETRON 4 MG PO TBDP: 4.0000 mg | ORAL_TABLET | Freq: Four times a day (QID) | ORAL | 0 refills | Status: AC | PRN

## 2021-09-17 MED ORDER — DOXYCYCLINE HYCLATE 100 MG PO TABS
100.0000 mg | ORAL_TABLET | Freq: Once | ORAL | Status: AC
  Administered 2021-09-17: 100 mg via ORAL
  Filled 2021-09-17: qty 1

## 2021-09-17 NOTE — ED Provider Notes (Signed)
CT Soft Tissue Neck W Contrast ? ?Result Date: 09/17/2021 ?CLINICAL DATA:  Palpable lump under chin, present for over a year but has increased in size and is now painful EXAM: CT NECK WITH CONTRAST TECHNIQUE: Multidetector CT imaging of the neck was performed using the standard protocol following the bolus administration of intravenous contrast. RADIATION DOSE REDUCTION: This exam was performed according to the departmental dose-optimization program which includes automated exposure control, adjustment of the mA and/or kV according to patient size and/or use of iterative reconstruction technique. CONTRAST:  72mL OMNIPAQUE IOHEXOL 300 MG/ML  SOLN COMPARISON:  None. FINDINGS: Pharynx and larynx: Nasal cavity and nasopharynx are unremarkable. The oral cavity and oropharynx are unremarkable. The parapharyngeal spaces are clear. The hypopharynx and larynx are unremarkable. Vocal folds are normal in appearance. There is no abnormal soft tissue mass or fluid collection. Salivary glands: The parotid and submandibular glands are unremarkable. Thyroid: Unremarkable. Lymph nodes: There is no pathologic lymphadenopathy in the neck. See below for discussion regarding the cystic lesion in the left submental region. Vascular: The major vessels of the neck are unremarkable. Limited intracranial: The imaged portions of the intracranial compartment are unremarkable. Visualized orbits: The imaged globes and orbits are unremarkable. Mastoids and visualized paranasal sinuses: There is mucosal thickening throughout the imaged paranasal sinuses, worst in the left sphenoid sinus. Skeleton: There is no acute osseous abnormality or aggressive osseous lesion. Upper chest: The imaged lung apices are clear. Other: There is a 1.4 cm x 1.4 cm peripherally enhancing cystic lesion in the left submental region which is contiguous with the overlying skin. There is no surrounding inflammatory change. Bilateral breast implants are noted. The soft  tissues are otherwise unremarkable. IMPRESSION: 1.4 cm peripherally enhancing cystic lesion in the left submental region contiguous with the overlying skin. Given reported presents for 1 year, this is favored to reflect a cystic lesion such as a sebaceous cyst which may now be infected. A necrotic level I lymph node is considered less likely but not entirely excluded. Consider sampling. Electronically Signed   By: Valetta Mole M.D.   On: 09/17/2021 08:08   ? ? ?Reviewed CT imaging as above, a cystic lesion is noted in the left submental region.  Urologist advising likely a sebaceous cyst that does not become infected.  Also a necrotic level and lymph node is considered less likely ? ?Discussed with the patient she has follow-up with dermatology in May and I advised her to continue this follow-up for reevaluation of this cyst.  Appears now to become infected, she reports it is tender and is swollen over the last couple of days. ? ?Reports severe penicillin allergy.  Has taken doxycycline in the past.  Reports it does cause some nausea but otherwise tolerates ? ?We will prescribe 10 days of doxycycline and also Zofran for associated nausea.  Discussed follow-up with dermatology as planned, but also interim follow-up with ENT is recommended for resolution of symptoms an urgent follow-up with them would seem appropriate which I discussed with the patient.  Discussed return precautions with her as well, she has significant change severe worsening fevers difficulty swallowing with or feels her infection is not improving she understands to return the emergency department as well. ? ?Return precautions and treatment recommendations and follow-up discussed with the patient who is agreeable with the plan. ? ? ?Patient is fully awake alert in no distress.  Understanding agreeable with this plan of treatment. ? ?Vitals:  ? 09/17/21 0538 09/17/21 0920  ?BP: Marland Kitchen)  156/94 (!) 142/101  ?Pulse: (!) 120 98  ?Resp: 18 14  ?Temp: 98.4 ?F  (36.9 ?C) 98.3 ?F (36.8 ?C)  ?SpO2: 98% 99%  ? ? ?  Delman Kitten, MD ?09/17/21 [phone] ? ?

## 2021-09-17 NOTE — ED Triage Notes (Signed)
Pt present via POV with a complaint of a large bump under her chin. She notes that the bump has been present for over a year but has doubled in size last night and it is painful to touch which is abnormal. Pt has an appointment to see Dermatology in May. Denies CP or SOB.  ?

## 2021-09-17 NOTE — ED Notes (Signed)
Patient transported to CT 

## 2021-09-17 NOTE — ED Provider Notes (Signed)
? ?[facility] ?Provider Note ? ? ? Event Date/Time  ? First MD Initiated Contact with Patient 09/17/21 [phone]   ?  (approximate) ? ? ?History  ? ?Mass ? ? ?HPI ? ?Joanna Lin is a 41 y.o. female who presents to the ED from home with a chief complaint of bump underneath her chin.  Patient reports the bump has been present for over 1 year and that she has a dermatology appointment to evaluate it and may.  Reports in the past day the area has grown double in size and is now painful.  Denies fever, chills, difficulty swallowing, chest pain, shortness of breath, abdominal pain, nausea, vomiting or dizziness. ?  ? ? ?Past Medical History  ? ?Past Medical History:  ?Diagnosis Date  ? Anxiety   ? Depression   ? Migraines   ? PTSD (post-traumatic stress disorder)   ? Substance abuse ([place])   ? Urinary incontinence   ? ? ? ?Active Problem List  ? ?Patient Active Problem List  ? Diagnosis Date Noted  ? Well female exam with routine gynecological exam 08/24/2018  ? Cannabis abuse 07/19/2018  ? PTSD (post-traumatic stress disorder) 07/19/2018  ? Anxiety state 07/13/2018  ? Insomnia 07/13/2018  ? ? ? ?Past Surgical History  ? ?Past Surgical History:  ?Procedure Laterality Date  ? AUGMENTATION MAMMAPLASTY    ? ? ? ?Home Medications  ? ?Prior to Admission medications   ?Medication Sig Start Date End Date Taking? Authorizing Provider  ?albuterol (VENTOLIN HFA) 108 (90 Base) MCG/ACT inhaler Inhale 2 puffs into the lungs every 6 (six) hours as needed for shortness of breath. 05/31/21   Menshew, Dannielle Karvonen, PA-C  ?benzonatate (TESSALON PERLES) 100 MG capsule Take 1-2 tabs TID prn cough 05/31/21   Menshew, Dannielle Karvonen, PA-C  ?brompheniramine-pseudoephedrine-DM 30-2-10 MG/5ML syrup Take 5 mLs by mouth 4 (four) times daily as needed. 05/31/21   Menshew, Dannielle Karvonen, PA-C  ?divalproex (DEPAKOTE ER) 250 MG 24 hr tablet TAKE 3 TABLETS (750 MG TOTAL) BY MOUTH EVERY EVENING. 11/21/18   Ursula Alert, MD   ?fluticasone (FLONASE) 50 MCG/ACT nasal spray Place 2 sprays into both nostrils daily. 01/19/21 01/19/22  Johnn Hai, PA-C  ?propranolol (INDERAL) 10 MG tablet TAKE 1 TABLET (10 MG TOTAL) BY MOUTH 3 (THREE) TIMES DAILY AS NEEDED. SEVERE ANXIETY 10/31/18   Ursula Alert, MD  ?QUEtiapine (SEROQUEL) 50 MG tablet TAKE 1 TABLET (50 MG TOTAL) BY MOUTH AT BEDTIME. FOR MOOD AND SLEEP 12/22/18   Ursula Alert, MD  ?Vilazodone HCl (VIIBRYD) 40 MG TABS Take 1 tablet (40 mg total) by mouth daily. 09/26/18   Ursula Alert, MD  ? ? ? ?Allergies  ?Penicillins and Hydrocodone ? ? ?Family History  ? ?Family History  ?Problem Relation Age of Onset  ? Lymphoma Mother   ?     times 2  ? Bipolar disorder Father   ? Drug abuse Father   ? Alcohol abuse Father   ? Diabetes Father   ? Hypertension Father   ? Stroke Father   ? Drug abuse Paternal Uncle   ? Alcohol abuse Paternal Grandfather   ? Anxiety disorder Paternal Grandmother   ? Thyroid disease Paternal Grandmother   ? Thyroid cancer Paternal Grandmother   ? Diabetes Maternal Grandmother   ? Alcohol abuse Maternal Grandfather   ? Colon cancer Paternal Uncle   ? ? ? ?Physical Exam  ?Triage Vital Signs: ?ED Triage Vitals [09/17/21 0538]  ?Enc Vitals  Group  ?   BP (!) 156/94  ?   Pulse Rate (!) 120  ?   Resp 18  ?   Temp 98.4 ?F (36.9 ?C)  ?   Temp src   ?   SpO2 98 %  ?   Weight 180 lb (81.6 kg)  ?   Height 5\' 4"  (1.626 m)  ?   Head Circumference   ?   Peak Flow   ?   Pain Score 8  ?   Pain Loc   ?   Pain Edu?   ?   Excl. in [place]?   ? ? ?Updated Vital Signs: ?BP (!) 156/94   Pulse (!) 120   Temp 98.4 ?F (36.9 ?C)   Resp 18   Ht 5\' 4"  (1.626 m)   Wt 81.6 kg   LMP 09/01/2021 (Exact Date)   SpO2 98%   BMI 30.90 kg/m?  ? ? ?General: Awake, no distress.  Strong smell of tobacco smoke. ?CV:  Tachycardic.  Good peripheral perfusion.  ?Resp:  Normal effort.  CTA B. ?Abd:  Nontender.  No distention.  ?Other:  Pea-sized freely mobile mass beneath chin which is tender to palpation.   No fluctuance.  Oropharynx is clear.  No thyromegaly. ? ? ?ED Results / Procedures / Treatments  ?Labs ?(all labs ordered are listed, but only abnormal results are displayed) ?Labs Reviewed  ?CBC WITH DIFFERENTIAL/PLATELET - Abnormal; Notable for the following components:  ?    Result Value  ? WBC 13.0 (*)   ? Neutro Abs 10.2 (*)   ? All other components within normal limits  ?BASIC METABOLIC PANEL  ?TSH  ?T4, FREE  ? ? ? ?EKG ? ?None ? ? ?RADIOLOGY ?CT soft tissue neck with contrast is pending ? ? ?Official radiology report(s): ?No results found. ? ? ?PROCEDURES: ? ?Critical Care performed: No ? ?Procedures ? ? ?MEDICATIONS ORDERED IN ED: ?Medications  ?sodium chloride 0.9 % bolus 1,000 mL (1,000 mLs Intravenous New Bag/Given 09/17/21 0618)  ? ? ? ?IMPRESSION / MDM / ASSESSMENT AND PLAN / ED COURSE  ?I reviewed the triage vital signs and the nursing notes. ?             ?               ?41 year old female presenting with enlarging neck mass.  Differential diagnosis includes but is not limited to abscess, subcutaneous cyst, thyroid nodule, neck mass, etc. ? ?Patient noted to be tachycardic.  States she had a cigarette and walked from the parking lot.  Will check basic lab work including thyroid panel, initiate IV fluid resuscitation.  Will obtain CT soft tissue neck with contrast.  Will reassess.  I have personally reviewed patient's records and notes office visit from 01/02/2021 for elevated blood pressure without diagnosis of hypertension. ? ? ? ? ? ? ?Clinical Course as of 09/17/21 0651  ?Wed Sep 17, 2021  ?0651 Updated patient of WBC 13.  She is pending the rest of her lab work and CT scan.  Care will be transferred to the oncoming provider at change of shift. [JS]  ?  ?Clinical Course User Index ?[JS] Paulette Blanch, MD  ? ? ? ?FINAL CLINICAL IMPRESSION(S) / ED DIAGNOSES  ? ?Final diagnoses:  ?Neck mass  ? ? ? ?Rx / DC Orders  ? ?ED Discharge Orders   ? ? None  ? ?  ? ? ? ?Note:  This document was prepared using  [employer] and education [employer] and may include unintentional dictation errors. ?  ?Paulette Blanch, MD ?09/17/21 [phone] ? ?

## 2021-09-17 NOTE — Discharge Instructions (Signed)
Please follow-up with your scheduled dermatology appointment, and I also recommend an interim follow-up with our ear nose and throat team at [facility] ? ?Return to the ER if: ?You have a fever. ?You develop increasing redness, swelling, or pain around your cyst. ?You have fluid, blood, pus, or a bad smell coming from your cyst. ?You have a cyst that gets larger or comes back. ?You have difficulty swallowing ?Difficulty breathing ?Other new concerns or symptoms arise ?

## 2021-09-22 ENCOUNTER — Other Ambulatory Visit: Payer: Self-pay

## 2021-09-22 ENCOUNTER — Emergency Department
Admission: EM | Admit: 2021-09-22 | Discharge: 2021-09-22 | Disposition: A | Discharge status: Home / Self Care | Payer: 59 | Attending: Emergency Medicine | Admitting: Emergency Medicine

## 2021-09-22 DIAGNOSIS — M545 Low back pain, unspecified: Secondary | ICD-10-CM | POA: Insufficient documentation

## 2021-09-22 DIAGNOSIS — Z5321 Procedure and treatment not carried out due to patient leaving prior to being seen by health care provider: Secondary | ICD-10-CM | POA: Diagnosis not present

## 2021-09-22 NOTE — ED Triage Notes (Signed)
Pt presents via POV c/o lower back pain starting today. Reports spasm type pain.  ?

## 2022-05-05 IMAGING — CT CT NECK W/ CM
4 of 6 series · 12 of 33 positions shown, 14 images · IV contrast (APPLIED)
Comparison: None.

CLINICAL DATA: Palpable lump under chin, present for over a year
but has increased in size and is now painful

EXAM:
CT NECK WITH CONTRAST
TECHNIQUE: Multidetector CT imaging of the neck was performed using the
standard protocol following the bolus administration of intravenous
contrast.

[Series 3: axial neck · axial · 0.53mm/px · z∈[-248,-178]mm · 2 of 107 slices shown, 3 images]
[im 36/107  soft-tissue]
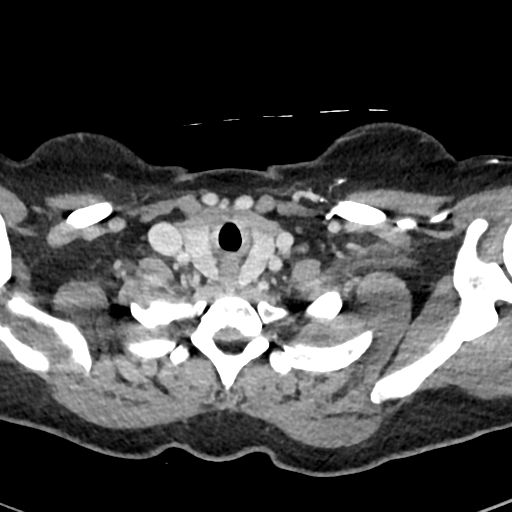
[im 36/107  bone]
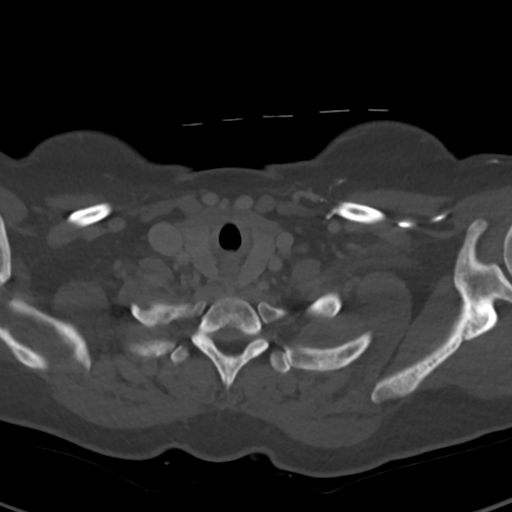
[im 71/107  bone]
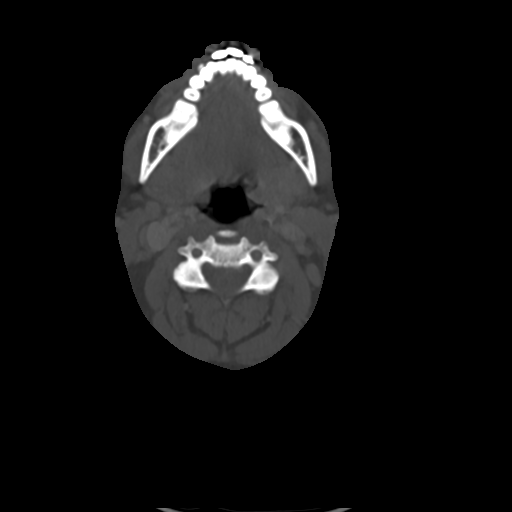

[Series 6: sag neck · sagittal · 0.41mm/px · 5 of 87 slices shown, 6 images]
[im 29/87  bone]
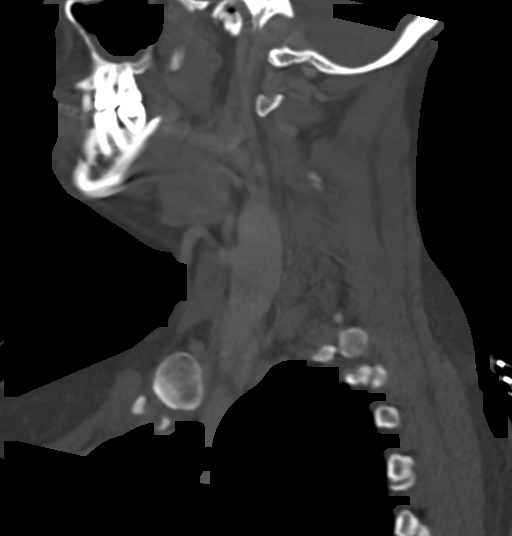
[im 36/87  bone]
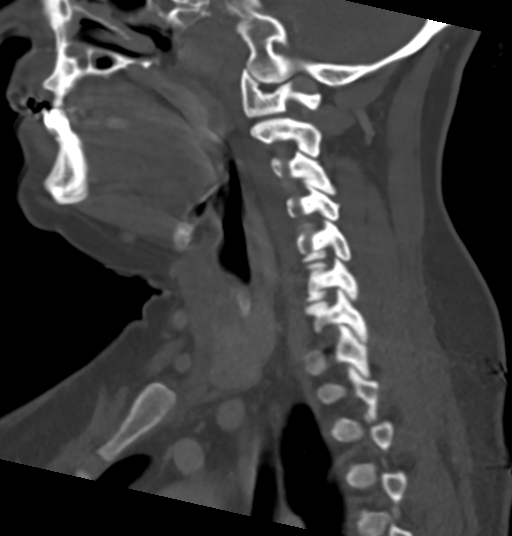
[im 44/87  soft-tissue]
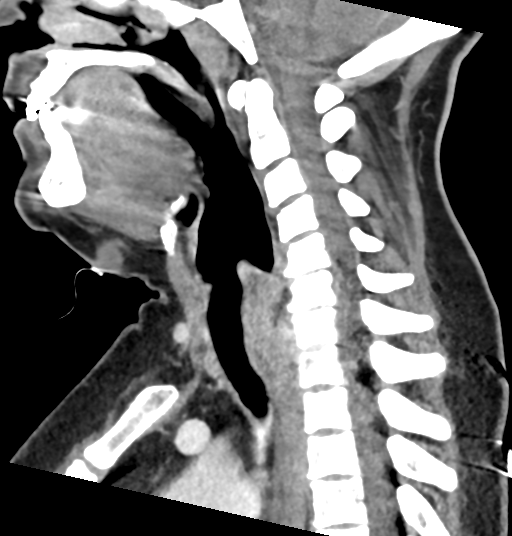
[im 44/87  bone]
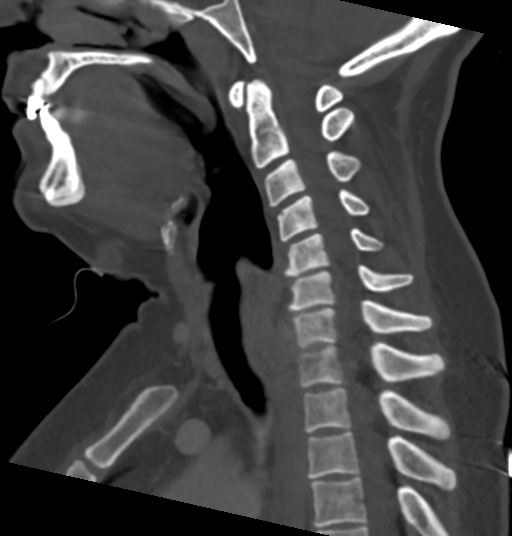
[im 51/87  bone]
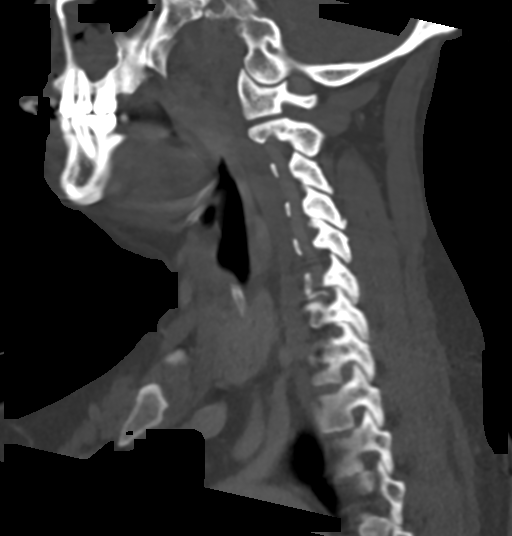
[im 58/87  bone]
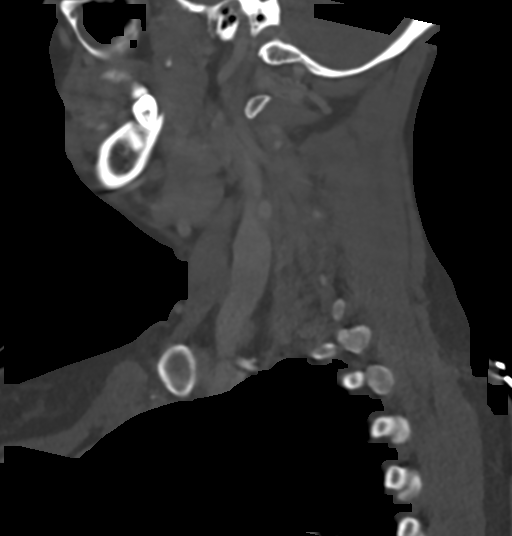

[Series 7: cor neck · coronal · 0.44mm/px · 3 of 102 slices shown]
[im 21/102  bone]
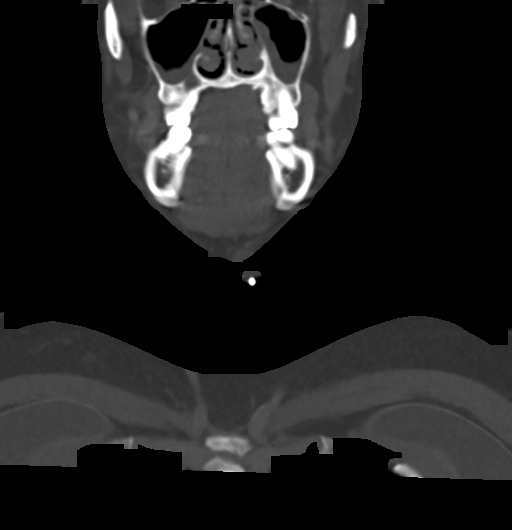
[im 41/102  bone]
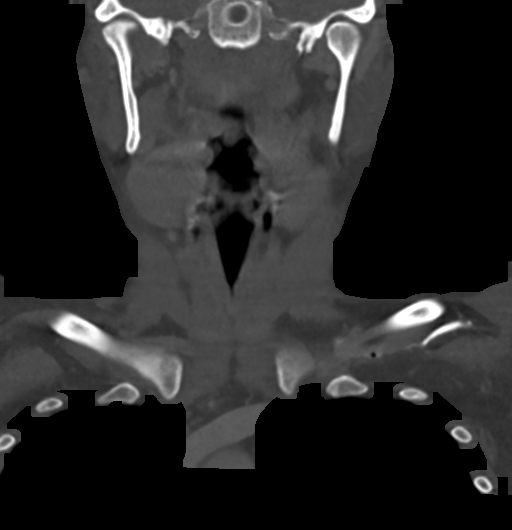
[im 61/102  bone]
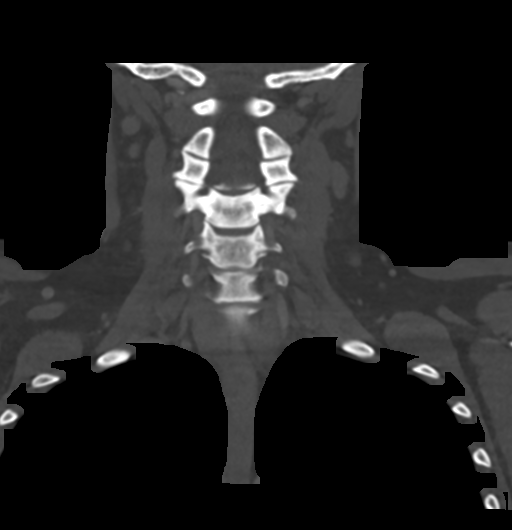

[Series 8: ax oropharynx · axial · 0.36mm/px · z∈[-287,-220]mm · 2 of 109 slices shown]
[im 37/109  bone]
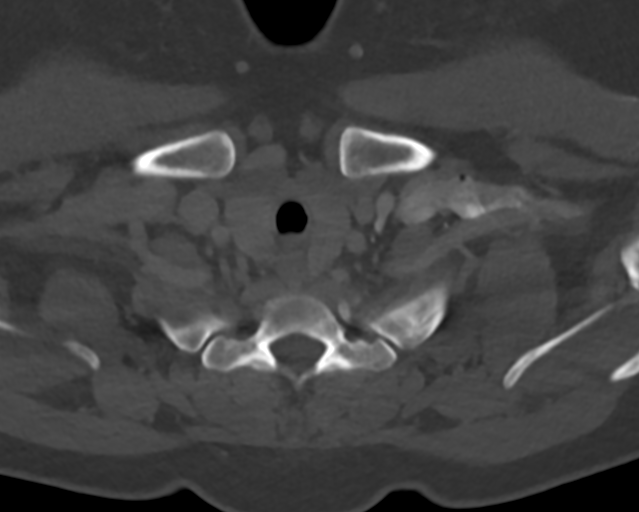
[im 73/109  bone]
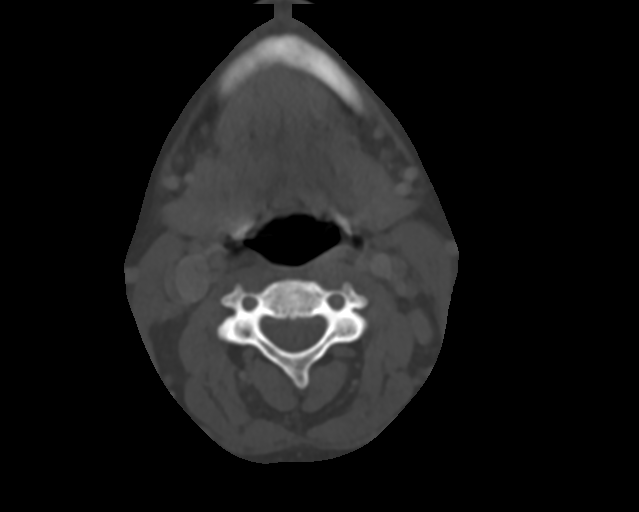

[12 of 33 positions shown; findings below may reference images not displayed]

RADIATION DOSE REDUCTION: This exam was performed according to the
departmental dose-optimization program which includes automated
exposure control, adjustment of the mA and/or kV according to
patient size and/or use of iterative reconstruction technique.

CONTRAST:  75mL OMNIPAQUE IOHEXOL 300 MG/ML  SOLN
FINDINGS: Pharynx and larynx: Nasal cavity and nasopharynx are unremarkable.

The oral cavity and oropharynx are unremarkable. The parapharyngeal
spaces are clear.

The hypopharynx and larynx are unremarkable. Vocal folds are normal
in appearance. There is no abnormal soft tissue mass or fluid
collection.

Salivary glands: The parotid and submandibular glands are
unremarkable.

Thyroid: Unremarkable.

Lymph nodes: There is no pathologic lymphadenopathy in the neck. See
below for discussion regarding the cystic lesion in the left
submental region.

Vascular: The major vessels of the neck are unremarkable.

Limited intracranial: The imaged portions of the intracranial
compartment are unremarkable.

Visualized orbits: The imaged globes and orbits are unremarkable.

Mastoids and visualized paranasal sinuses: There is mucosal
thickening throughout the imaged paranasal sinuses, worst in the
left sphenoid sinus.

Skeleton: There is no acute osseous abnormality or aggressive
osseous lesion.

Upper chest: The imaged lung apices are clear.

Other: There is a 1.4 cm x 1.4 cm peripherally enhancing cystic
lesion in the left submental region which is contiguous with the
overlying skin. There is no surrounding inflammatory change.
Bilateral breast implants are noted. The soft tissues are otherwise
unremarkable.
IMPRESSION: 1.4 cm peripherally enhancing cystic lesion in the left submental
region contiguous with the overlying skin. Given reported presents
for 1 year, this is favored to reflect a cystic lesion such as a
sebaceous cyst which may now be infected. A necrotic level I lymph
node is considered less likely but not entirely excluded. Consider
sampling.

## 2023-09-07 NOTE — Progress Notes (Signed)
 [facility] FOLLOW-UP VISIT PCP: Michnowicz, Kelly Macintosh, NP   Subjective:   Chief Complaint  Patient presents with  . Mental Health Problem  . Hypertension    (HTN call list)     Level of Interpreter Services: No interpreter needed (no language barrier)    History of Present Illness Joanna Lin is a 43 year old female who presents for a follow-up regarding her mental health and hypertension.  Since last visit she re-established with psychiatry for her mental health medication management.  She is currently taking Vyvanse 40 mg, Seroquel  25 mg at bedtime, and receives an Abilify injection every 28 days. She notes significant improvement in her mental health, describing it as a 'night and day difference'. Her house is clean, she can get out of her bedroom, and she has had friends over. Her children are happy, and she is able to function better. She notes that her ADHD medication may not be effective throughout the entire day as she starts losing her phone around 2 or 3 PM.  She is not currently monitoring her blood pressure at home due to financial constraints but notes that it has been borderline high still when checked. She is taking amlodipine 5mg  as prescribed.   She has reduced her smoking to about a third of a pack per day by switching to cigarettes she dislikes.  She reports plantar fasciitis in both feet. She has been doing stretches and using an ice bottle, which has helped. She took gabapentin 400 mg every four hours, which provided relief, 300 mg was ineffective. She mentions her father has similar foot issues and a history of high arches.  BP Readings from Last 5 Encounters:  09/07/23 (!) 135/90  07/30/23 (!) 130/98  07/16/23 (!) 142/91  04/02/23 (!) 149/96  05/27/21 130/80   Wt Readings from Last 5 Encounters:  09/07/23 83.4 kg (183 lb 12.8 oz)  07/16/23 85.2 kg (187 lb 14.4 oz)  05/27/21 77 kg (169 lb 12.8 oz)  01/02/21 82.6 kg (182 lb 3.2 oz)  11/19/20 85.7 kg  (189 lb)     GAD 7 result:    02/01/2018    4:00 PM 07/16/2023   11:35 AM 09/07/2023    9:20 AM  GAD-7  Over the past 2 weeks, have you felt nervous, anxious, or on edge? Not at all Nearly every day Not at all  Over the past 2 weeks, have you not been able to stop or control worrying? Several days More than half the days Not at all  Worrying too much about different things Not at all Nearly every day Not at all  Trouble relaxing Nearly every day Nearly every day Not at all  Being so restless that it is hard to sit still Not at all Nearly every day Not at all  Becoming easily annoyed or irritable Nearly every day Several days Several days  Feeling afraid as if something awful might happen Not at all More than half the days Not at all  GAD-7 Total Score 7 17 1  *  --     How difficult have these problems made it for you to do your work, take care of things at home, or get along with other people?   Not difficult at all    * Patient-reported    PHQ 2/9 last 3 flowsheet values    11/05/2020    4:03 PM 07/16/2023   10:19 AM 09/07/2023    9:20 AM  PHQ-2/9 Depression Screening  Little interest or pleasure in doing things  3 0  Feeling down, depressed, or hopeless  3 0  Patient Health Questionnaire-2 Score  6 0 *  Trouble falling or staying asleep, or sleeping too much  3 0  Feeling tired or having little energy  3 0  Poor appetite or overeating  3 0  Feeling bad about yourself - or that you are a failure or have let yourself or your family down  3 0  Trouble concentrating on things, such as reading the newspaper or watching television  3 0  Moving or speaking so slowly that other people could have noticed? Or the opposite - being so fidgety or restless that you have been moving around a lot more than usual.  0 0  Thoughts that you would be better off dead or hurting yourself in some way  0 0  How difficult have these problems made it for you to do your work, take care of things at home,  or get along with other people?  Extremely difficult   Patient Health Questionnaire-9 Score  21 0 *  (OBSOLETE) Little interest or pleasure in doing things 0    (OBSOLETE) Feeling down, depressed, or hopeless (or irritable for Teens only)? 0    (OBSOLETE) Total Prescreening Score 0    (OBSOLETE) Total Score = 0      * Patient-reported  Depression Severity and Treatment Recommendations:  0-4= None  5-9= Mild / Treatment: Support, educate to call if worse; return in one month  10-14= Moderate / Treatment: Support, watchful waiting; Antidepressant or Psychotherapy  15-19= Moderately severe / Treatment: Antidepressant OR Psychotherapy  >= 20 = Major depression, severe / Antidepressant AND Psychotherapy   Answers submitted by the patient for this visit: Recent Medical Symptoms (Submitted on 09/07/2023) Night sweats: No Appetite Loss: No Weight loss: No Fever: No Daytime sleepiness: No visual change: No Hearing loss: No Sore throat: No Hoarseness: No Cough: No Hemoptysis (Coughing up blood): No Shortness of breath: No Wheezing: No Chest pain: No Tachycardia (heart racing): No leg pain: No Nausea: No Vomiting: No Diarrhea: No Constipation: No Abdominal pain: No Bowel habits change: No Melena (Black tar-like stool): No Dysuria (Pain with urination): No Frequency (The need to urinate many times a day): No Hesitancy (Difficulty in beginning the flow of urine): No Joint pain: Yes Joint swelling: Yes Myalgias (Muscle pain): Yes Rash: No Pigment changes/discoloration: No Memory loss: No Syncope (Fainting or passing out): No Extremity weakness: No Paresthesias (Pins and Needles): No Loss of balance: No   History: Patient Active Problem List  Diagnosis  . History of wheezing  . Insomnia  . Smoking  . Plantar fasciitis of left foot  . Anemia affecting pregnancy in third trimester (HHS-HCC)  . Anemia, iron deficiency  . B12 deficiency  . Extremely Fast Labor  .  Anxiety  . History of migraine  . Nephrolithiasis  . PTSD (post-traumatic stress disorder)  . Cannabis abuse  . Well female exam with routine gynecological exam  . LGSIL on Pap smear of cervix  . ADHD (attention deficit hyperactivity disorder)  . Borderline high blood pressure  . Severe episode of recurrent major depressive disorder (CMS/HHS-HCC)  . Primary hypertension  . Esophageal dysphagia  . Family history of colonic polyps  . Vitamin D deficiency    Current Outpatient Medications on File Prior to Visit  Medication Sig Dispense Refill  . ABILIFY MAINTENA 300 mg IM syringe Inject 300 mg into the muscle  every 28 (twenty-eight) days    . albuterol  MDI, PROVENTIL , VENTOLIN , PROAIR , HFA 90 mcg/actuation inhaler Inhale 2 inhalations into the lungs every 6 (six) hours as needed for Wheezing 1 each 1  . ergocalciferol, vitamin D2, 1,250 mcg (50,000 unit) capsule Take 1 capsule (50,000 Units total) by mouth once a week for 8 doses 8 capsule 0  . QUEtiapine  (SEROQUEL ) 25 MG tablet Take 1 tablet (25 mg total) by mouth at bedtime 30 tablet 2  . VYVANSE 40 mg capsule Take 40 mg by mouth every morning    . cholecalciferol (VITAMIN D3) 2,000 unit tablet Take 1 tablet (2,000 Units total) by mouth once daily (Patient not taking: Reported on 09/07/2023) 100 tablet 3  . cyanocobalamin (VITAMIN B12) 1000 MCG tablet Take 1 tablet (1,000 mcg total) by mouth once daily (Patient not taking: Reported on 09/07/2023) 100 tablet 3   No current facility-administered medications on file prior to visit.    Social History   Socioeconomic History  . Marital status: Married  Tobacco Use  . Smoking status: Former    Types: Cigarettes  . Smokeless tobacco: Never  . Tobacco comments:    Currently uses e cig- fills reservioir every three days, currently weaning down since found out she is pregnant  Vaping Use  . Vaping status: Every Day  Substance and Sexual Activity  . Alcohol use: Yes  . Drug use: Yes     Types: Marijuana  . Sexual activity: Yes    Partners: Male    Birth control/protection: Surgical    Comment: husband s/p vasectomy   Social Drivers of Health   Financial Resource Strain: Low Risk  (07/16/2023)   Overall Financial Resource Strain (CARDIA)   . Difficulty of Paying Living Expenses: Not hard at all  Food Insecurity: No Food Insecurity (07/16/2023)   Hunger Vital Sign   . Worried About Programme researcher, broadcasting/film/video in the Last Year: Never true   . Ran Out of Food in the Last Year: Never true  Transportation Needs: No Transportation Needs (07/16/2023)   PRAPARE - Transportation   . Lack of Transportation (Medical): No   . Lack of Transportation (Non-Medical): No  Physical Activity: Sufficiently Active (07/13/2018)   Received from [facility] [place]   Exercise Vital Sign   . Days of Exercise per Week: 5 days   . Minutes of Exercise per Session: 30 min  Stress: Stress Concern Present (07/13/2018)   [facility]   [facility] of Occupational [facility] - Occupational Stress Questionnaire   . Feeling of Stress : Very much  Social Connections: Unknown (07/13/2018)   Received from [facility], [facility]   Social Connection and Isolation Panel [NHANES]   . Attends Religious Services: More than 4 times per year   . Active Member of Clubs or Organizations: Yes   . Attends Banker Meetings: More than 4 times per year   . Marital Status: Married  Housing Stability: Low Risk  (07/16/2023)   Housing Stability Vital Sign   . Unable to Pay for Housing in the Last Year: No   . Number of Times Moved in the Last Year: 0   . Homeless in the Last Year: No    Objective:   Vitals:   09/07/23 1107 09/07/23 1114  BP: (!) 139/96 (!) 135/90  Pulse: 98 101  Weight: 83.4 kg (183 lb 12.8 oz)   Height: 162.6 cm (5' 4.02)   PainSc: 0-No pain  Body mass index is 31.53 kg/m.  Physical Exam:  General appearance - alert, well appearing, and in no  distress Mental status - alert, oriented to person, place, and time Chest - clear to auscultation, no wheezes, rales or rhonchi, symmetric air entry Heart - normal rate, regular rhythm, normal S1, S2, no murmurs, rubs, clicks or gallops    Assessment and Plan:  Diagnoses and all orders for this visit:  Primary hypertension Assessment: Blood pressure under suboptimal control. Plan: -  Increase amlodipine (Norvasc) -  Recheck in 2 months, or sooner should new symptoms or problems arise.  -  Goal of BP <130/80 -  Patient counseled on smoking cessation. -     amLODIPine (NORVASC) 10 MG tablet; Take 1 tablet (10 mg total) by mouth once daily  Bilateral foot pain Bilateral foot pain managed with stretches, ice, and gabapentin. Gabapentin effective. - Prescribe gabapentin 400 mg as needed. - Provide a list of local podiatrists for further evaluation if needed. -     gabapentin (NEURONTIN) 400 MG capsule; Take 1 capsule (400 mg total) by mouth 3 (three) times daily  PTSD (post-traumatic stress disorder) Severe episode of recurrent major depressive disorder, without psychotic features (CMS/HHS-HCC) Severe episode of recurrent major depressive disorder, without psychotic features Significant improvement with Abilify and Seroquel .  Now re-established with psychiatry - Continue current treatment and specialist follow up  -     Depression Screen -(PHQ- 2/9, BDI) -     Anxiety Screen [NUR6106]  Attention deficit hyperactivity disorder (ADHD), unspecified ADHD type Vyvanse 40 mg insufficient for full-day symptom control. Non-pharmacological strategies considered. - Continue current treatment and specialist follow up  - Consider non-pharmacological strategies such as using a fanny pack.  Tobacco use disorder Reduced smoking to seven cigarettes per day by switching brands. - Continue current efforts   - plans to start vitamins D and B12.  - Colonoscopy and endoscopy scheduled.    Future  Appointments     Date/Time Provider Department Center Visit Type   11/10/2023 11:00 AM (Arrive by 10:45 AM) Michnowicz, Kelly Macintosh, NP [facility] OFFICE VISIT       This note has been created using automated tools and reviewed for accuracy by ELISE PATRICIA MICHNOWICZ.  This visit was coded based on medical decision making (MDM). *Some images could not be shown.

## 2024-03-01 ENCOUNTER — Emergency Department

## 2024-03-01 ENCOUNTER — Emergency Department
Admission: EM | Admit: 2024-03-01 | Discharge: 2024-03-01 | Disposition: A | Discharge status: Home / Self Care | Attending: Emergency Medicine | Admitting: Emergency Medicine

## 2024-03-01 ENCOUNTER — Other Ambulatory Visit: Payer: Self-pay

## 2024-03-01 DIAGNOSIS — Z7289 Other problems related to lifestyle: Secondary | ICD-10-CM | POA: Diagnosis not present

## 2024-03-01 DIAGNOSIS — R911 Solitary pulmonary nodule: Secondary | ICD-10-CM | POA: Diagnosis not present

## 2024-03-01 DIAGNOSIS — I10 Essential (primary) hypertension: Secondary | ICD-10-CM | POA: Diagnosis not present

## 2024-03-01 DIAGNOSIS — R456 Violent behavior: Secondary | ICD-10-CM | POA: Diagnosis not present

## 2024-03-01 DIAGNOSIS — R4689 Other symptoms and signs involving appearance and behavior: Secondary | ICD-10-CM

## 2024-03-01 DIAGNOSIS — R0789 Other chest pain: Secondary | ICD-10-CM | POA: Diagnosis present

## 2024-03-01 DIAGNOSIS — Z765 Malingerer [conscious simulation]: Secondary | ICD-10-CM

## 2024-03-01 LAB — BASIC METABOLIC PANEL WITH GFR
Anion gap: 8 (ref 5–15)
BUN: 12 mg/dL (ref 6–20)
CO2: 24 mmol/L (ref 22–32)
Calcium: 8.5 mg/dL — ABNORMAL LOW (ref 8.9–10.3)
Chloride: 107 mmol/L (ref 98–111)
Creatinine, Ser: 0.79 mg/dL (ref 0.44–1.00)
GFR, Estimated: >60 mL/min (ref >60)
Glucose, Bld: 85 mg/dL (ref 70–99)
Potassium: 3.9 mmol/L (ref 3.5–5.1)
Sodium: 139 mmol/L (ref 135–145)

## 2024-03-01 LAB — CBC
HCT: 35.7 % — ABNORMAL LOW (ref 36.0–46.0)
Hemoglobin: 12 g/dL (ref 12.0–15.0)
MCH: 30.1 pg (ref 26.0–34.0)
MCHC: 33.6 g/dL (ref 30.0–36.0)
MCV: 89.5 fL (ref 80.0–100.0)
Platelets: 338 K/uL (ref 150–400)
RBC: 3.99 MIL/uL (ref 3.87–5.11)
RDW: 13.9 % (ref 11.5–15.5)
WBC: 11.1 K/uL — ABNORMAL HIGH (ref 4.0–10.5)
nRBC: 0 % (ref 0.0–0.2)

## 2024-03-01 LAB — HCG, QUANTITATIVE, PREGNANCY: hCG, Beta Chain, Quant, S: <1 m[IU]/mL (ref <5)

## 2024-03-01 LAB — TROPONIN I (HIGH SENSITIVITY)
Troponin I (High Sensitivity): 3 ng/L (ref <18)
Troponin I (High Sensitivity): <2 ng/L (ref <18)

## 2024-03-01 MED ORDER — MORPHINE SULFATE (PF) 4 MG/ML IV SOLN: 4.0000 mg | Freq: Once | INTRAVENOUS | Status: DC

## 2024-03-01 MED ORDER — IBUPROFEN 800 MG PO TABS: 800.0000 mg | ORAL_TABLET | Freq: Three times a day (TID) | ORAL | 0 refills | Status: AC | PRN

## 2024-03-01 MED ORDER — LIDOCAINE 5 % EX PTCH
1.0000 | MEDICATED_PATCH | Freq: Once | CUTANEOUS | Status: DC
  Administered 2024-03-01: 1 via TRANSDERMAL
  Filled 2024-03-01: qty 1

## 2024-03-01 MED ORDER — SODIUM CHLORIDE 0.9 % IV BOLUS (SEPSIS)
1000.0000 mL | Freq: Once | INTRAVENOUS | Status: AC
  Administered 2024-03-01: 1000 mL via INTRAVENOUS

## 2024-03-01 MED ORDER — IOHEXOL 350 MG/ML SOLN
75.0000 mL | Freq: Once | INTRAVENOUS | Status: AC | PRN
  Administered 2024-03-01: 75 mL via INTRAVENOUS

## 2024-03-01 MED ORDER — ONDANSETRON HCL 4 MG/2ML IJ SOLN: 4.0000 mg | Freq: Once | INTRAMUSCULAR | Status: DC

## 2024-03-01 MED ORDER — METHOCARBAMOL 1000 MG/10ML IJ SOLN
500.0000 mg | Freq: Once | INTRAMUSCULAR | Status: AC
  Administered 2024-03-01: 500 mg via INTRAVENOUS
  Filled 2024-03-01: qty 5

## 2024-03-01 MED ORDER — METHOCARBAMOL 500 MG PO TABS: 500.0000 mg | ORAL_TABLET | Freq: Three times a day (TID) | ORAL | 0 refills | Status: AC | PRN

## 2024-03-01 MED ORDER — KETOROLAC TROMETHAMINE 30 MG/ML IJ SOLN
30.0000 mg | Freq: Once | INTRAMUSCULAR | Status: AC
  Administered 2024-03-01: 30 mg via INTRAVENOUS
  Filled 2024-03-01: qty 1

## 2024-03-01 MED ORDER — LIDOCAINE 5 % EX PTCH: 1.0000 | MEDICATED_PATCH | CUTANEOUS | 0 refills | Status: AC

## 2024-03-01 NOTE — Telephone Encounter (Signed)
 Panel Manager Disposition:Office Today. Pt declines r/t no transportation. Requests appt in am and scheduled. No appts available w/PCP.    Reason for call: Patient is calling for:  Chief Complaint  Patient presents with  . Rib Pain     Today, seen at [facility], r/t atypical chest pain ,under breast, pulmonary nodule.  CT done. Pt discharged. Per ED notes, felt to be musculoskeletal chest pain .  Fri and Sat, has episodes of dizziness On Sunday, has a hard sneeze then had constant pain under left breast. Experiences sharp pain, worse 9/10, when moving and when taking a deep breath, and 5/10 when still.   Taking Advil  800 mgs every 6 hrs and Tylenol  1000 mgs twice daily and is not improving.  Denies SOB ,cough    Advised and unaware that ED sent Rx for Robaxin , Advil , Lidocaine  5% patches.    Actions taken during call: Encounter Signed. Routed encounter to the Clinic Scheduled Appointments  Future Appointments     Date/Time Provider Department Center Visit Type   03/02/2024 9:40 AM (Arrive by 9:25 AM) Elnor Vernell Priestly, PA [facility] SAME DAY       Triage: Triage completed, care advice given per protocol. Triage:Call back parameters given and caller advised of 24 hour nurse triage. Instructed to seek immediate medical attention if new symptoms develop, current symptoms worsen or if you become increasingly concerned. Patient verbalized understanding.  Triage:Triage disposition declined, the patient has been triaged and given the most appropriate disposition for their concerns. The patient is declining the advised disposition. . All pertinent patient questions and concerns have been addressed    KATE LITTIE FINDER, RN [facility] Patient Engagement Center  Patient Engagement Center Documentation    Reason for Disposition . All other patients with chest pain  Additional Information . Negative: SEVERE difficulty breathing (e.g., struggling for  each breath, speaks in single words) . Negative: Passed out (i.e., fainted, collapsed and was not responding) . Negative: Chest pain lasting longer than 5 minutes and ANY of the following:* Over 30 years old* Over 59 years old and at least one cardiac risk factor (i.e., high blood pressure, diabetes, high cholesterol, obesity, smoker or strong family history of heart disease)* Pain is crushing, pressure-like, or heavy * Took nitroglycerin and chest pain was not relieved* History of heart disease (i.e., angina, heart attack, bypass surgery, angioplasty, CHF) . Negative: Visible sweat on face or sweat dripping down face . Negative: Sounds like a life-threatening emergency to the triager . Negative: Followed an injury to chest . Negative: SEVERE chest pain . Negative: Pain also present in shoulder(s) or arm(s) or jaw . Negative: Difficulty breathing . Negative: Cocaine use within last 3 days . Negative: History of prior 'blood clot' in leg or lungs (i.e., deep vein thrombosis, pulmonary embolism) . Negative: Recent illness requiring prolonged bed rest (i.e., immobilization) . Negative: Hip or leg fracture in past 2 months (e.g, or had cast on leg or ankle) . Negative: Major surgery in the past month . Negative: Recent long-distance travel with prolonged time in car, bus, plane, or train (i.e., within past 2 weeks; 6 or more hours duration) . Negative: Heart beating irregularly or very rapidly . Negative: Chest pain lasting longer than 5 minutes . Negative: Intermittent chest pain and pain has been increasing in severity or frequency . Negative: Dizziness or lightheadedness . Negative: Coughing up blood . Negative: Patient sounds very sick or weak to the triager . Negative: Fever >  100.5 F (38.1 C) . Negative: Intermittent chest pains persist > 3 days  Protocols used: Chest Pain-A-[place]

## 2024-03-01 NOTE — Discharge Instructions (Addendum)
 You may alternate over the counter Tylenol 1000 mg every 6 hours as needed for pain, fever and Ibuprofen 800 mg every 6-8 hours as needed for pain, fever.  Please take Ibuprofen with food.  Do not take more than 4000 mg of Tylenol (acetaminophen) in a 24 hour period.

## 2024-03-01 NOTE — ED Triage Notes (Signed)
 Pt reports sneezing yesterday then began to have left side chest pain that is worse with palpation and movement. Pt denies known cardiac hx.

## 2024-03-01 NOTE — ED Provider Notes (Addendum)
 [facility] Provider Note    Event Date/Time   First MD Initiated Contact with Patient 03/01/24 [phone]     (approximate)   History   Chest Pain   HPI  Ezma Rehm is a 43 y.o. female with history of hypertension, PTSD, migraines, anxiety who presents to the emergency department with complaints of left lower chest pain.  She states that she sneezed and pain started  Denies any injury or increased physical exertion.  Pain is worse with deep inspiration but also palpation and movement.  No fevers, cough, lower extremity swelling or pain.  Denies history of PE or DVT.  Has been taking over-the-counter medications at home without relief.   History provided by patient.    Past Medical History:  Diagnosis Date   Anxiety    Depression    Migraines    PTSD (post-traumatic stress disorder)    Substance abuse (HCC)    Urinary incontinence     Past Surgical History:  Procedure Laterality Date   AUGMENTATION MAMMAPLASTY      MEDICATIONS:  Prior to Admission medications   Medication Sig Start Date End Date Taking? Authorizing Provider  albuterol  (VENTOLIN  HFA) 108 (90 Base) MCG/ACT inhaler Inhale 2 puffs into the lungs every 6 (six) hours as needed for shortness of breath. 05/31/21   Menshew, Candida LULLA Kings, PA-C  benzonatate  (TESSALON  PERLES) 100 MG capsule Take 1-2 tabs TID prn cough 05/31/21   Menshew, Jenise V Bacon, PA-C  brompheniramine-pseudoephedrine-DM 30-2-10 MG/5ML syrup Take 5 mLs by mouth 4 (four) times daily as needed. 05/31/21   Menshew, Candida LULLA Kings, PA-C  divalproex  (DEPAKOTE  ER) 250 MG 24 hr tablet TAKE 3 TABLETS (750 MG TOTAL) BY MOUTH EVERY EVENING. 11/21/18   Eappen, Saramma, MD  doxycycline  (VIBRAMYCIN ) 100 MG capsule Take 1 capsule (100 mg total) by mouth 2 (two) times daily. 09/17/21   Dicky Anes, MD  fluticasone  (FLONASE ) 50 MCG/ACT nasal spray Place 2 sprays into both nostrils daily. 01/19/21 01/19/22  Saunders Shona CROME, PA-C   ondansetron  (ZOFRAN -ODT) 4 MG disintegrating tablet Take 1 tablet (4 mg total) by mouth every 6 (six) hours as needed for nausea or vomiting. 09/17/21   Dicky Anes, MD  propranolol  (INDERAL ) 10 MG tablet TAKE 1 TABLET (10 MG TOTAL) BY MOUTH 3 (THREE) TIMES DAILY AS NEEDED. SEVERE ANXIETY 10/31/18   Eappen, Saramma, MD  QUEtiapine  (SEROQUEL ) 50 MG tablet TAKE 1 TABLET (50 MG TOTAL) BY MOUTH AT BEDTIME. FOR MOOD AND SLEEP 12/22/18   Eappen, Saramma, MD  Vilazodone  HCl (VIIBRYD ) 40 MG TABS Take 1 tablet (40 mg total) by mouth daily. 09/26/18   Eappen, Saramma, MD    Physical Exam   Triage Vital Signs: ED Triage Vitals  Encounter Vitals Group     BP 03/01/24 0236 (!) 166/97     Girls Systolic BP Percentile --      Girls Diastolic BP Percentile --      Boys Systolic BP Percentile --      Boys Diastolic BP Percentile --      Pulse Rate 03/01/24 0236 75     Resp 03/01/24 0236 20     Temp 03/01/24 0236 98.4 F (36.9 C)     Temp src --      SpO2 03/01/24 0236 99 %     Weight 03/01/24 0234 180 lb (81.6 kg)     Height 03/01/24 0234 5' 3 (1.6 m)     Head Circumference --  Peak Flow --      Pain Score 03/01/24 0234 10     Pain Loc --      Pain Education --      Exclude from Growth Chart --     Most recent vital signs: Vitals:   03/01/24 0619 03/01/24 0620  BP:  (!) 156/102  Pulse:  68  Resp:    Temp: 98.1 F (36.7 C)   SpO2:  100%    CONSTITUTIONAL: Alert, responds appropriately to questions. Well-appearing; well-nourished, appears anxious HEAD: Normocephalic, atraumatic EYES: Conjunctivae clear, pupils appear equal, sclera nonicteric ENT: normal nose; moist mucous membranes NECK: Supple, normal ROM CARD: RRR; S1 and S2 appreciated, tender to palpation over the left lateral chest wall without crepitus or deformity RESP: Normal chest excursion without splinting or tachypnea; breath sounds clear and equal bilaterally; no wheezes, no rhonchi, no rales, no hypoxia or respiratory  distress, speaking full sentences ABD/GI: Non-distended; soft, non-tender, no rebound, no guarding, no peritoneal signs BACK: The back appears normal EXT: Normal ROM in all joints; no deformity noted, no edema, no calf tenderness or calf swelling SKIN: Normal color for age and race; warm; no rash on exposed skin NEURO: Moves all extremities equally, normal speech PSYCH: The patient's mood and manner are appropriate.   ED Results / Procedures / Treatments   LABS: (all labs ordered are listed, but only abnormal results are displayed) Labs Reviewed  BASIC METABOLIC PANEL WITH GFR - Abnormal; Notable for the following components:      Result Value   Calcium 8.5 (*)    All other components within normal limits  CBC - Abnormal; Notable for the following components:   WBC 11.1 (*)    HCT 35.7 (*)    All other components within normal limits  HCG, QUANTITATIVE, PREGNANCY  TROPONIN I (HIGH SENSITIVITY)  TROPONIN I (HIGH SENSITIVITY)     EKG:  EKG Interpretation Date/Time:  Wednesday March 01 2024 02:34:43 EDT Ventricular Rate:  80 PR Interval:  142 QRS Duration:  74 QT Interval:  360 QTC Calculation: 415 R Axis:   71  Text Interpretation: Normal sinus rhythm Normal ECG No previous ECGs available Confirmed by Neomi Neptune [phone]) on 03/01/2024 4:56:52 AM         RADIOLOGY: My personal review and interpretation of imaging: CTA chest shows no rib fracture or PE.  I have personally reviewed all radiology reports.   CT Angio Chest PE W and/or Wo Contrast Result Date: 03/01/2024 EXAM: CTA of the Chest with contrast for PE 03/01/2024 06:11:04 AM TECHNIQUE: CTA of the chest was performed after the administration of intravenous contrast. Multiplanar reformatted images are provided for review. MIP images are provided for review. Automated exposure control, iterative reconstruction, and/or weight based adjustment of the mA/kV was utilized to reduce the radiation dose to as low as  reasonably achievable. COMPARISON: None available. CLINICAL HISTORY: Pulmonary embolism (PE) suspected, high prob; L lower chest pain, eval ribs. Pt reports sneezing yesterday then began to have left side chest pain that is worse with palpation and movement. Pt denies known cardiac hx. FINDINGS: PULMONARY ARTERIES: Pulmonary arteries are adequately opacified for evaluation. No pulmonary embolism. Main pulmonary artery is normal in caliber. MEDIASTINUM: The heart and pericardium demonstrate no acute abnormality. There is no acute abnormality of the thoracic aorta. LYMPH NODES: No mediastinal, hilar or axillary lymphadenopathy. LUNGS AND PLEURA: 3 mm left apical lung nodule is identified, image 13/6. No focal consolidation or pulmonary edema. No pleural effusion  or pneumothorax. UPPER ABDOMEN: Limited images of the upper abdomen are unremarkable. SOFT TISSUES AND BONES: Status post bilateral breast augmentation. No acute bone or soft tissue abnormality. IMPRESSION: 1. No pulmonary embolism. 2. 3 mm left apical lung nodule.In a patient who was at low risk, no further follow-up is indicated. If the patient is at increased risk a follow-up CT of the chest without contrast material in 12 months may be considered Electronically signed by: Waddell Calk MD 03/01/2024 06:28 AM EDT RP Workstation: [id number]   DG Chest 2 View Result Date: 03/01/2024 EXAM: 2 VIEW(S) XRAY OF THE CHEST 03/01/2024 03:00:00 AM COMPARISON: 05/31/21. CLINICAL HISTORY: CP. Pt reports sneezing yesterday then began to have left side chest pain that is worse with palpation and movement. Pt denies known cardiac hx. FINDINGS: LUNGS AND PLEURA: No focal pulmonary opacity. No pulmonary edema. No pleural effusion. No pneumothorax. HEART AND MEDIASTINUM: No acute abnormality of the cardiac and mediastinal silhouettes. BONES AND SOFT TISSUES: No acute osseous abnormality. IMPRESSION: 1. No acute process. Electronically signed by: Norman Gatlin MD  03/01/2024 03:21 AM EDT RP Workstation: [id number]     PROCEDURES:  Critical Care performed: No    .1-3 Lead EKG Interpretation  Performed by: Claborn Janusz, Josette SAILOR, DO Authorized by: Zakhia Seres, Josette SAILOR, DO     Interpretation: normal     ECG rate:  75   ECG rate assessment: normal     Rhythm: sinus rhythm     Ectopy: none     Conduction: normal       IMPRESSION / MDM / ASSESSMENT AND PLAN / ED COURSE  I reviewed the triage vital signs and the nursing notes.    Patient here for pleuritic chest pain, shortness of breath.  The patient is on the cardiac monitor to evaluate for evidence of arrhythmia and/or significant heart rate changes.   DIFFERENTIAL DIAGNOSIS (includes but not limited to):   Chest wall pain, costochondritis, rib fracture, PE, less likely ACS, dissection, pneumonia, pneumothorax, CHF   Patient's presentation is most consistent with acute presentation with potential threat to life or bodily function.   PLAN: EKG nonischemic.  First troponin negative.  Second pending.  Has slight leukocytosis which could be reactive.  Denies any fevers, cough.  Chest x-ray reviewed and interpreted by myself and the radiologist and is unremarkable.  Will obtain CTA of the chest to rule out PE as she is intermittently tachycardic on my exam although this could be due to pain, anxiety.  Discussed with patient that CT will also evaluate for any bony abnormality as she is very concerned that she could have a rib fracture.  Will provide with Toradol , Robaxin , Lidoderm  patch for symptomatic relief.   MEDICATIONS GIVEN IN ED: Medications  lidocaine  (LIDODERM ) 5 % 1 patch (1 patch Transdermal Patch Applied 03/01/24 0520)  sodium chloride  0.9 % bolus 1,000 mL (0 mLs Intravenous Stopped 03/01/24 0706)  ketorolac  (TORADOL ) 30 MG/ML injection 30 mg (30 mg Intravenous Given 03/01/24 0527)  methocarbamol  (ROBAXIN ) injection 500 mg (500 mg Intravenous Given 03/01/24 0615)  iohexol  (OMNIPAQUE ) 350  MG/ML injection 75 mL (75 mLs Intravenous Contrast Given 03/01/24 0602)     ED COURSE: Second troponin negative.  CTA of the chest shows no rib fracture, PE, edema, PTX or other acute abnormality.  She does have a small pulmonary nodule.  She can follow-up with her PCP for this for annual imaging.  Will discharge with Lidoderm  patches, ibuprofen , Robaxin .  Suspect pain is musculoskeletal in nature.  6:50 AM  I went into the room to reassess the patient and explain her test results.  Patient became upset that she was being discharged.  Attempted to explain to her that life-threatening illness had been ruled out but she refused to allow me to speak without interrupting me, raising her voice and cursing at me.  Patient stating that I am gaslighting her and I need to work on my bedside manner because I attempted to explain to her that my job was to rule out emergencies and that sometimes we do not always find an answer for her pain but I had discussed with her earlier that this was likely musculoskeletal in nature given it is reproducible with palpation and movement.  Tried to explain to the patient that we have ruled out ACS, PE, other acute life-threatening abnormalities but again patient continued to interrupt me and refused to allow me to finish his sentence.  Patient continued to state I am not a drug seeker.  Discussed with patient that this was never mentioned and I had offered her additional pain medication here as she has a ride but she refuses stating that she will just go home and look up everything on MyChart.  I attempted to redirect the patient multiple times and calm her down but this seemed to aggravate her further.  Given my concerns for my safety given patient's behavior and our zero tolerance policy for aggressive behavior I left the room. Security will go to the bedside with the nurse to remove the IV given patient's escalating verbal aggression towards staff.  I do not feel any  further emergent workup needs to be done at this time and she can follow-up with her outpatient provider as needed for her musculoskeletal chest pain.  I did send prescriptions for ibuprofen , Lidoderm  patches and Robaxin  to her outpatient pharmacy.  On further review of her records it does appear patient has substance abuse listed as a prior diagnosis.  I wonder if some of her behavior may be related to drug-seeking behavior given how quickly she escalated after not initially receiving narcotic pain medication.  I also think that some of her behavior is a little related to her mental health conditions.  No indication however for emergent psychiatric evaluation.  Given patient's behavior and history, I do not feel comfortable sending narcotic pain medication to her pharmacy for her chest wall pain.  She has been discharged with prescriptions for anti-inflammatories, muscle relaxers.  I suspect her condition will improve with time.  Security reports patient's IV was removed prior to discharge and her husband arrived to take her home.  At this time, I do not feel there is any life-threatening condition present. I reviewed all nursing notes, vitals, pertinent previous records.  All lab and urine results, EKGs, imaging ordered have been independently reviewed and interpreted by myself.  I reviewed all available radiology reports from any imaging ordered this visit.  Based on my assessment, I feel the patient is safe to be discharged home without further emergent workup and can continue workup as an outpatient as needed. Discussed all findings, treatment plan as well as usual and customary return precautions.  They verbalize understanding and are comfortable with this plan.  Outpatient follow-up has been provided as needed.  All questions have been answered.    CONSULTS:  none   OUTSIDE RECORDS REVIEWED: Reviewed family medicine note in March 2025.       FINAL CLINICAL IMPRESSION(S) / ED DIAGNOSES  Final diagnoses:  Atypical chest pain  Pulmonary nodule  Aggressive behavior  Drug-seeking behavior     Rx / DC Orders   ED Discharge Orders          Ordered    ibuprofen  (ADVIL ) 800 MG tablet  Every 8 hours PRN        03/01/24 0635    lidocaine  (LIDODERM ) 5 %  Every 24 hours        03/01/24 0635    methocarbamol  (ROBAXIN ) 500 MG tablet  Every 8 hours PRN        03/01/24 9364             Note:  This document was prepared using Dragon voice recognition software and may include unintentional dictation errors.       Eesa Justiss, Josette SAILOR, DO 03/01/24 [phone]
